# Patient Record
Sex: Female | Born: 1965 | Race: White | Hispanic: No | Marital: Married | Smoking: Former smoker
Health system: Southern US, Community
[De-identification: ages and names within clinical notes are randomized; demographics above are authoritative.]

## PROBLEM LIST (undated history)

## (undated) DIAGNOSIS — R51 Headache: Secondary | ICD-10-CM

## (undated) DIAGNOSIS — R519 Headache, unspecified: Secondary | ICD-10-CM

## (undated) DIAGNOSIS — N200 Calculus of kidney: Secondary | ICD-10-CM

## (undated) DIAGNOSIS — R112 Nausea with vomiting, unspecified: Secondary | ICD-10-CM

## (undated) DIAGNOSIS — M1990 Unspecified osteoarthritis, unspecified site: Secondary | ICD-10-CM

## (undated) DIAGNOSIS — R609 Edema, unspecified: Secondary | ICD-10-CM

## (undated) DIAGNOSIS — Z9889 Other specified postprocedural states: Secondary | ICD-10-CM

## (undated) DIAGNOSIS — G459 Transient cerebral ischemic attack, unspecified: Secondary | ICD-10-CM

## (undated) DIAGNOSIS — R600 Localized edema: Secondary | ICD-10-CM

## (undated) DIAGNOSIS — K219 Gastro-esophageal reflux disease without esophagitis: Secondary | ICD-10-CM

## (undated) DIAGNOSIS — M797 Fibromyalgia: Secondary | ICD-10-CM

## (undated) DIAGNOSIS — R200 Anesthesia of skin: Secondary | ICD-10-CM

## (undated) DIAGNOSIS — R569 Unspecified convulsions: Secondary | ICD-10-CM

## (undated) HISTORY — PX: KNEE ARTHROSCOPY: SUR90

## (undated) HISTORY — PX: HAND SURGERY: SHX662

## (undated) HISTORY — DX: Calculus of kidney: N20.0

## (undated) HISTORY — DX: Localized edema: R60.0

## (undated) HISTORY — PX: OOPHORECTOMY: SHX86

## (undated) HISTORY — DX: Anesthesia of skin: R20.0

## (undated) HISTORY — DX: Edema, unspecified: R60.9

---
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
## 1988-11-30 HISTORY — PX: APPENDECTOMY: SHX54

## 2009-11-30 HISTORY — PX: CARPAL TUNNEL RELEASE: SHX101

## 2013-10-13 ENCOUNTER — Ambulatory Visit: Payer: Self-pay | Admitting: Family Medicine

## 2014-02-20 ENCOUNTER — Ambulatory Visit: Payer: Self-pay | Admitting: Family Medicine

## 2014-03-07 ENCOUNTER — Emergency Department: Payer: Self-pay | Admitting: Emergency Medicine

## 2014-03-07 LAB — COMPREHENSIVE METABOLIC PANEL
AST: 51 U/L — AB (ref 15–37)
Albumin: 3.8 g/dL (ref 3.4–5.0)
Alkaline Phosphatase: 70 U/L
Anion Gap: 4 — ABNORMAL LOW (ref 7–16)
BUN: 20 mg/dL — ABNORMAL HIGH (ref 7–18)
Bilirubin,Total: 0.4 mg/dL (ref 0.2–1.0)
CHLORIDE: 106 mmol/L (ref 98–107)
CO2: 25 mmol/L (ref 21–32)
Calcium, Total: 8.5 mg/dL (ref 8.5–10.1)
Creatinine: 0.49 mg/dL — ABNORMAL LOW (ref 0.60–1.30)
Glucose: 111 mg/dL — ABNORMAL HIGH (ref 65–99)
OSMOLALITY: 273 (ref 275–301)
POTASSIUM: 4.1 mmol/L (ref 3.5–5.1)
SGPT (ALT): 46 U/L (ref 12–78)
SODIUM: 135 mmol/L — AB (ref 136–145)
Total Protein: 7.9 g/dL (ref 6.4–8.2)

## 2014-03-07 LAB — URINALYSIS, COMPLETE
BACTERIA: NONE SEEN
BLOOD: NEGATIVE
Bilirubin,UR: NEGATIVE
GLUCOSE, UR: NEGATIVE mg/dL (ref 0–75)
Ketone: NEGATIVE
LEUKOCYTE ESTERASE: NEGATIVE
NITRITE: NEGATIVE
Ph: 8 (ref 4.5–8.0)
Protein: NEGATIVE
RBC,UR: <1 /HPF (ref 0–5)
Specific Gravity: 1.015 (ref 1.003–1.030)
Squamous Epithelial: 2

## 2014-03-07 LAB — TROPONIN I: Troponin-I: <0.02 ng/mL

## 2014-03-07 LAB — CK TOTAL AND CKMB (NOT AT ARMC)
CK, Total: 146 U/L
CK-MB: 1.4 ng/mL (ref 0.5–3.6)

## 2014-03-07 LAB — CBC
HCT: 40.4 % (ref 35.0–47.0)
HGB: 13.7 g/dL (ref 12.0–16.0)
MCH: 35.7 pg — ABNORMAL HIGH (ref 26.0–34.0)
MCHC: 33.8 g/dL (ref 32.0–36.0)
MCV: 106 fL — ABNORMAL HIGH (ref 80–100)
Platelet: 248 10*3/uL (ref 150–440)
RBC: 3.83 10*6/uL (ref 3.80–5.20)
RDW: 13.5 % (ref 11.5–14.5)
WBC: 6 10*3/uL (ref 3.6–11.0)

## 2014-03-19 ENCOUNTER — Ambulatory Visit: Payer: Self-pay | Admitting: Neurology

## 2014-03-19 ENCOUNTER — Inpatient Hospital Stay: Payer: Self-pay | Admitting: Internal Medicine

## 2014-03-19 DIAGNOSIS — I369 Nonrheumatic tricuspid valve disorder, unspecified: Secondary | ICD-10-CM

## 2014-03-19 LAB — URINALYSIS, COMPLETE
Bacteria: NONE SEEN
Bilirubin,UR: NEGATIVE
Blood: NEGATIVE
Glucose,UR: NEGATIVE mg/dL (ref 0–75)
Ketone: NEGATIVE
Leukocyte Esterase: NEGATIVE
Nitrite: NEGATIVE
PROTEIN: NEGATIVE
Ph: 9 (ref 4.5–8.0)
RBC, UR: NONE SEEN /HPF (ref 0–5)
Specific Gravity: 1.009 (ref 1.003–1.030)
WBC UR: 1 /HPF (ref 0–5)

## 2014-03-19 LAB — COMPREHENSIVE METABOLIC PANEL
ALK PHOS: 61 U/L
ALT: 29 U/L (ref 12–78)
AST: 26 U/L (ref 15–37)
Albumin: 3.7 g/dL (ref 3.4–5.0)
Anion Gap: 7 (ref 7–16)
BUN: 13 mg/dL (ref 7–18)
Bilirubin,Total: 0.3 mg/dL (ref 0.2–1.0)
CALCIUM: 8.1 mg/dL — AB (ref 8.5–10.1)
CHLORIDE: 107 mmol/L (ref 98–107)
CREATININE: 0.71 mg/dL (ref 0.60–1.30)
Co2: 28 mmol/L (ref 21–32)
EGFR (African American): >60
Glucose: 87 mg/dL (ref 65–99)
Osmolality: 283 (ref 275–301)
Potassium: 3.5 mmol/L (ref 3.5–5.1)
Sodium: 142 mmol/L (ref 136–145)
TOTAL PROTEIN: 7 g/dL (ref 6.4–8.2)

## 2014-03-19 LAB — CBC WITH DIFFERENTIAL/PLATELET
BASOS ABS: 0.1 10*3/uL (ref 0.0–0.1)
Basophil %: 1.2 %
EOS ABS: 0.1 10*3/uL (ref 0.0–0.7)
Eosinophil %: 1 %
HCT: 38.4 % (ref 35.0–47.0)
HGB: 12.7 g/dL (ref 12.0–16.0)
LYMPHS PCT: 44.3 %
Lymphocyte #: 2.4 10*3/uL (ref 1.0–3.6)
MCH: 34.2 pg — ABNORMAL HIGH (ref 26.0–34.0)
MCHC: 33 g/dL (ref 32.0–36.0)
MCV: 104 fL — ABNORMAL HIGH (ref 80–100)
Monocyte #: 0.5 x10 3/mm (ref 0.2–0.9)
Monocyte %: 9 %
NEUTROS ABS: 2.5 10*3/uL (ref 1.4–6.5)
Neutrophil %: 44.5 %
PLATELETS: 232 10*3/uL (ref 150–440)
RBC: 3.71 10*6/uL — ABNORMAL LOW (ref 3.80–5.20)
RDW: 13.3 % (ref 11.5–14.5)
WBC: 5.5 10*3/uL (ref 3.6–11.0)

## 2014-03-19 LAB — ETHANOL
Ethanol %: <0.003 % (ref 0.000–0.080)
Ethanol: <3 mg/dL

## 2014-03-19 LAB — PROTIME-INR
INR: 0.9
PROTHROMBIN TIME: 11.6 s (ref 11.5–14.7)

## 2014-03-19 LAB — DRUG SCREEN, URINE
Amphetamines, Ur Screen: NEGATIVE (ref ?–1000)
Barbiturates, Ur Screen: NEGATIVE (ref ?–200)
Benzodiazepine, Ur Scrn: NEGATIVE (ref ?–200)
Cannabinoid 50 Ng, Ur ~~LOC~~: NEGATIVE (ref ?–50)
Cocaine Metabolite,Ur ~~LOC~~: NEGATIVE (ref ?–300)
MDMA (Ecstasy)Ur Screen: NEGATIVE (ref ?–500)
Methadone, Ur Screen: NEGATIVE (ref ?–300)
OPIATE, UR SCREEN: POSITIVE (ref ?–300)
PHENCYCLIDINE (PCP) UR S: NEGATIVE (ref ?–25)
TRICYCLIC, UR SCREEN: NEGATIVE (ref ?–1000)

## 2014-03-20 LAB — CBC WITH DIFFERENTIAL/PLATELET
BASOS ABS: 0 10*3/uL (ref 0.0–0.1)
Basophil %: 0.9 %
EOS PCT: 1.1 %
Eosinophil #: 0 10*3/uL (ref 0.0–0.7)
HCT: 37.6 % (ref 35.0–47.0)
HGB: 12.8 g/dL (ref 12.0–16.0)
Lymphocyte #: 1.5 10*3/uL (ref 1.0–3.6)
Lymphocyte %: 36.7 %
MCH: 35.8 pg — ABNORMAL HIGH (ref 26.0–34.0)
MCHC: 34 g/dL (ref 32.0–36.0)
MCV: 105 fL — ABNORMAL HIGH (ref 80–100)
Monocyte #: 0.4 x10 3/mm (ref 0.2–0.9)
Monocyte %: 10 %
Neutrophil #: 2.1 10*3/uL (ref 1.4–6.5)
Neutrophil %: 51.3 %
PLATELETS: 217 10*3/uL (ref 150–440)
RBC: 3.57 10*6/uL — AB (ref 3.80–5.20)
RDW: 12.9 % (ref 11.5–14.5)
WBC: 4 10*3/uL (ref 3.6–11.0)

## 2014-03-20 LAB — LIPID PANEL
Cholesterol: 203 mg/dL — ABNORMAL HIGH (ref 0–200)
HDL: 107 mg/dL — AB (ref 40–60)
Ldl Cholesterol, Calc: 79 mg/dL (ref 0–100)
TRIGLYCERIDES: 87 mg/dL (ref 0–200)
VLDL Cholesterol, Calc: 17 mg/dL (ref 5–40)

## 2014-03-20 LAB — BASIC METABOLIC PANEL
ANION GAP: 4 — AB (ref 7–16)
BUN: 11 mg/dL (ref 7–18)
CO2: 26 mmol/L (ref 21–32)
CREATININE: 0.82 mg/dL (ref 0.60–1.30)
Calcium, Total: 8.9 mg/dL (ref 8.5–10.1)
Chloride: 110 mmol/L — ABNORMAL HIGH (ref 98–107)
EGFR (African American): >60
EGFR (Non-African Amer.): >60
Glucose: 106 mg/dL — ABNORMAL HIGH (ref 65–99)
Osmolality: 279 (ref 275–301)
POTASSIUM: 3.3 mmol/L — AB (ref 3.5–5.1)
Sodium: 140 mmol/L (ref 136–145)

## 2014-03-20 LAB — TSH: THYROID STIMULATING HORM: 2.59 u[IU]/mL

## 2014-05-04 ENCOUNTER — Ambulatory Visit: Payer: Self-pay | Admitting: Physician Assistant

## 2014-05-09 ENCOUNTER — Ambulatory Visit: Payer: Self-pay | Admitting: Obstetrics and Gynecology

## 2014-05-09 LAB — BASIC METABOLIC PANEL
Anion Gap: 5 — ABNORMAL LOW (ref 7–16)
BUN: 11 mg/dL (ref 7–18)
CHLORIDE: 106 mmol/L (ref 98–107)
CO2: 26 mmol/L (ref 21–32)
Calcium, Total: 9 mg/dL (ref 8.5–10.1)
Creatinine: 0.7 mg/dL (ref 0.60–1.30)
EGFR (African American): >60
GLUCOSE: 73 mg/dL (ref 65–99)
Osmolality: 272 (ref 275–301)
POTASSIUM: 3.1 mmol/L — AB (ref 3.5–5.1)
Sodium: 137 mmol/L (ref 136–145)

## 2014-05-09 LAB — CBC
HCT: 40.2 % (ref 35.0–47.0)
HGB: 13.3 g/dL (ref 12.0–16.0)
MCH: 33.8 pg (ref 26.0–34.0)
MCHC: 33 g/dL (ref 32.0–36.0)
MCV: 102 fL — ABNORMAL HIGH (ref 80–100)
Platelet: 278 10*3/uL (ref 150–440)
RBC: 3.93 10*6/uL (ref 3.80–5.20)
RDW: 14.1 % (ref 11.5–14.5)
WBC: 7.8 10*3/uL (ref 3.6–11.0)

## 2014-05-14 ENCOUNTER — Ambulatory Visit: Payer: Self-pay | Admitting: Obstetrics and Gynecology

## 2014-05-16 LAB — PATHOLOGY REPORT

## 2014-07-22 ENCOUNTER — Emergency Department: Payer: Self-pay | Admitting: Emergency Medicine

## 2014-09-03 ENCOUNTER — Ambulatory Visit: Payer: Self-pay | Admitting: Orthopedic Surgery

## 2014-09-17 ENCOUNTER — Ambulatory Visit: Payer: Self-pay | Admitting: Orthopedic Surgery

## 2014-09-17 LAB — PROTIME-INR
INR: 0.8
Prothrombin Time: 11.2 secs — ABNORMAL LOW (ref 11.5–14.7)

## 2014-09-17 LAB — URINALYSIS, COMPLETE
BILIRUBIN, UR: NEGATIVE
Bacteria: NONE SEEN
Blood: NEGATIVE
Glucose,UR: NEGATIVE mg/dL (ref 0–75)
Ketone: NEGATIVE
Leukocyte Esterase: NEGATIVE
Nitrite: NEGATIVE
PH: 8 (ref 4.5–8.0)
PROTEIN: NEGATIVE
Specific Gravity: 1.012 (ref 1.003–1.030)
Squamous Epithelial: 2
WBC UR: NONE SEEN /HPF (ref 0–5)

## 2014-09-17 LAB — BASIC METABOLIC PANEL
Anion Gap: 8 (ref 7–16)
BUN: 13 mg/dL (ref 7–18)
CHLORIDE: 106 mmol/L (ref 98–107)
CO2: 28 mmol/L (ref 21–32)
Calcium, Total: 8.2 mg/dL — ABNORMAL LOW (ref 8.5–10.1)
Creatinine: 0.82 mg/dL (ref 0.60–1.30)
EGFR (African American): >60
EGFR (Non-African Amer.): >60
GLUCOSE: 124 mg/dL — AB (ref 65–99)
OSMOLALITY: 285 (ref 275–301)
POTASSIUM: 3.6 mmol/L (ref 3.5–5.1)
Sodium: 142 mmol/L (ref 136–145)

## 2014-09-17 LAB — CBC
HCT: 41.3 % (ref 35.0–47.0)
HGB: 13.6 g/dL (ref 12.0–16.0)
MCH: 35 pg — AB (ref 26.0–34.0)
MCHC: 32.8 g/dL (ref 32.0–36.0)
MCV: 107 fL — ABNORMAL HIGH (ref 80–100)
PLATELETS: 221 10*3/uL (ref 150–440)
RBC: 3.88 10*6/uL (ref 3.80–5.20)
RDW: 14.4 % (ref 11.5–14.5)
WBC: 5.5 10*3/uL (ref 3.6–11.0)

## 2014-09-17 LAB — APTT: ACTIVATED PTT: 26.5 s (ref 23.6–35.9)

## 2014-09-20 ENCOUNTER — Ambulatory Visit: Payer: Self-pay | Admitting: Orthopedic Surgery

## 2014-11-30 DIAGNOSIS — G459 Transient cerebral ischemic attack, unspecified: Secondary | ICD-10-CM

## 2014-11-30 HISTORY — DX: Transient cerebral ischemic attack, unspecified: G45.9

## 2014-11-30 HISTORY — PX: KNEE ARTHROSCOPY: SUR90

## 2014-11-30 HISTORY — PX: ABDOMINAL HYSTERECTOMY: SHX81

## 2015-03-23 NOTE — H&P (Signed)
PATIENT NAME:  Charlotte Hernandez, Charlotte Hernandez MR#:  [id number] DATE OF BIRTH:  Jan 02, 1966  DATE OF ADMISSION:  03/19/2014  PRIMARY CARE PROVIDER: Dr. Helane Rima.   CHIEF COMPLAINT: Left-sided facial droop, left-sided numbness.   HISTORY OF PRESENT ILLNESS: The patient is a 49 year old white female with history of seizure disorder, reflux who states that she woke up this morning with left-sided facial droop, and also was having full face numbness, as well as left-sided numbness. The patient reports that she had similar symptoms with the left-sided numbness last week, was seen in the ED; also had chest pain, was discharged with diagnosis of costochondritis, who presents with these symptoms. She states that her whole left side of her body feels like it is asleep. She otherwise denies any double vision. Her facial droop is better. Her speech is better as well. She had difficulty initially with saying what she wanted to say. The patient, when she arrived in the ED, tele neurology was consulted. They did not recommend tPA. She, otherwise, denies any fevers, chills. She complains of pain in the left lower abdomen for which she is being worked up as pelvic pain and has been referred to a pelvic pain clinic at [facility]; is still awaiting an appointment.   PAST MEDICAL HISTORY: Significant for seizure disorder, history of enlarged lymph nodes in her groin, has GERD, is on hydrochlorothiazide and likely has hypertension, but the patient denies having hypertension.   ALLERGIES: None.   CURRENT MEDICATIONS: At home, she is on hydrochlorothiazide 12.5 p.o. daily, Ativan 1 mg every 8 hours as needed, ibuprofen 800 mg t.i.d. p.r.n. for pain, Prilosec 40 daily, Topamax 100, 1 tab p.o. b.i.d.; Tylenol PM 500 mg with 25 of Benadryl at bedtime p.r.n., vitamin B12, 250 daily; Prilosec 40 daily.   ALLERGIES: None.   SOCIAL HISTORY: Smokes cigarettes along with using vapor. States that she is trying to quit. Denies any alcohol use or  drug use.   FAMILY HISTORY: Positive for hypertension, diabetes.   REVIEW OF SYSTEMS:  CONSTITUTIONAL:  Generally, denies any weight gain, weight loss. Denies any fevers or chills. Complains of fatigue.  HEENT: Denies any blurred vision, double vision. No inflammation. No cataracts. No glaucoma. Denies any epistaxis. No nasal drainage. No seasonal allergies. No difficulty swallowing. No ear pain. No tinnitus.  CARDIOVASCULAR: Denies any chest pain, palpitations. No syncope. No arrhythmias.  PULMONARY: Denies any cough, wheezing, hemoptysis. No COPD.  GASTROINTESTINAL: Denies any nausea, vomiting, diarrhea. No abdominal pain. No hematemesis. No hematochezia.  GENITOURINARY: Denies any frequency, urgency or hesitancy.  Has chronic left lower quadrant abdominal pain. MUSCULOSKELETAL: Denies any pain in the neck, back or shoulder.   SKIN: Denies any rash.  LYMPHATICS: Denies any lymph node enlargement.  VASCULAR: Denies any claudications.  NEUROLOGIC: Has a history of seizure disorder; last seizure 2 years ago. No previous history of CVA, TIA.  PSYCHIATRIC: Has some anxiety.   PHYSICAL EXAMINATION: VITAL SIGNS: Temperature 98.2, pulse 76, respirations 18, blood pressure 124/75, O2 100%.  GENERAL: The patient is a well-developed, well-nourished female in no acute distress.  HEENT: Head: Atraumatic, normocephalic. Pupils equally round, reactive to light and accommodation. There is no conjunctival pallor. No scleral icterus. Nasal exam shows no drainage or ulceration.  Oropharynx is clear without any exudate.  NECK: Supple without any JVD.  CARDIOVASCULAR: Regular rate and rhythm. No murmurs, rubs,  clicks or gallops. LUNGS: Clear to auscultation bilaterally without any rales, rhonchi, wheezing.  ABDOMEN: Soft, nontender, nondistended. Positive bowel sounds  x 4.  EXTREMITIES: No clubbing, cyanosis or edema.  SKIN: No rash.  LYMPHATICS: No lymph nodes palpable.  VASCULAR: Good DP, PT pulses.   PSYCHIATRIC: Appears with a flat affect. Currently, not anxious. Awake, alert, oriented x 3.   NEUROLOGICAL: Cranial nerves II through XII grossly intact. I do not appreciate much of a facial droop. Strength is intact in all 4 extremities. Reflexes 2+. There is no diminished sensation noted to touch.   LABORATORY, RADIOLOGIC AND DIAGNOSTICS:  EKG: Normal sinus rhythm without any ST-T wave changes. Labs:  Glucose 87, BUN 13, creatinine 0.71, sodium 142, potassium 3.5, chloride 107, CO2 is 28. Alcohol level less than 0.003. LFTs are normal. Toxic urine drug screen positive for opioids. WBC 5.5, hemoglobin 12.7, platelet count 232. CT scan of the head without contrast shows no acute abnormality.   ASSESSMENT AND PLAN: The patient is a 49 year old white female, who presents with fascial droop and left-sided weakness.  1.  Facial droop, left-sided weakness, possible transient ischemic attack possible cerebrovascular accident. Her presentation is kind of atypical with the whole facial numbness. At this time, we will await MRI results, get carotid Dopplers, echocardiogram, place her on aspirin. I will ask neurology to see due to the distribution of findings or symptoms. We will also check an echocardiogram of the heart.  2.  Hypertension. We will continue hydrochlorothiazide.  3.  Gastroesophageal reflux disease. We will continue PPIs.  4.  Seizure disorder. Continue Topamax as taking at home.  5.  Miscellaneous: The patient will be on Lovenox for DVT prophylaxis.   TIME SPENT: 45 minutes on this H and P.      ____________________________ Serita GritShreyang H. Allena KatzPatel, MD shp:dmm D: 03/19/2014 11:19:07 ET T: 03/19/2014 11:33:17 ET JOB#: [id number]  cc: Mayrani Khamis H. Allena KatzPatel, MD, <Dictator> Charise CarwinSHREYANG H Miri Jose MD ELECTRONICALLY SIGNED 03/21/2014 16:25

## 2015-03-23 NOTE — Op Note (Signed)
PATIENT NAME:  Charlotte Hernandez, HARTSFIELD MR#:  [id number] DATE OF BIRTH:  02-11-1966  DATE OF PROCEDURE:  09/20/2014  PREOPERATIVE DIAGNOSIS: Left knee anterior cruciate ligament tear, lateral meniscus tear and partial medial collateral ligament and posterior collateral ligament tears.   POSTOPERATIVE DIAGNOSIS: Complete tear of left anterior cruciate ligament, longitudinal tear of the posterior horn of the lateral meniscus, partial medial collateral ligament tear, and sprain of the posterior cruciate ligament.   PROCEDURE PERFORMED: Left knee anterior cruciate ligament reconstruction with hybrid hamstring autograft and allograft with lateral meniscus repair.   ANESTHESIA: General with left femoral nerve block.   SURGEON: Timoteo Gaul, MD   ESTIMATED BLOOD LOSS: Minimal.   COMPLICATIONS: None.   IMPLANTS: Arthrex TightRope RC for femoral fixation, a 9 x 28 mm Arthrex BioComposite screw with a Richards spiked staple for back-up tibial fixation.   INDICATION FOR PROCEDURE: Charlotte Hernandez is a 49 year old female who slipped in her garage taking out the trash. She had persistent pain and instability in the left knee. An MRI documented a tear of her ACL, tear of the posterior horn of the lateral meniscus, a high-grade partial tear of the medial collateral ligament, and sprain of the posterior collateral ligament. Given the patient's persistent instability, she wished to proceed with surgery.    In the office, prior to the date of surgery, I reviewed the risks and benefits of surgery with the patient. These risks include: Infection, bleeding, nerve or blood vessel injury, knee stiffness, persistent pain or instability, hardware failure, retear of the ACL or retear of the lateral meniscus, arthrofibrosis, and the need for further surgery. Medical risks include, but are not limited to, DVT and pulmonary embolism, myocardial infarction, stroke, pneumonia, respiratory failure and death. The patient understood  these risks and wished to proceed.   PROCEDURE NOTE: The patient was met in the preoperative area. Her husband was at the bedside. I marked the left knee with the word "yes" and my initials, according to the hospital's right site protocol. I answered all the patient's questions. I updated her history and physical.   The patient was then brought to the Operating Room where she was placed supine on the operative table. She underwent a femoral nerve block by the anesthesia service. She then underwent general anesthesia. A timeout was performed to verify the patient's name, date of birth, medical record number, correct side of the surgery and the correct procedure to be performed. It was also used to verify the patient had received antibiotics, and that the appropriate instruments, implants, and radiographic studies were available in the room. Once all in attendance were in agreement, the case began.   Examination under anesthesia revealed the patient had 0-120 degrees range of motion. She had no large effusion. Her skin was intact. The patient had a positive Lachman test with anterior instability of approximately 5-10 mm. There was a positive pivot shift and anterior laxity on anterior drawer testing. She did not have a posterior drawer sign. The patient also did not have significant laxity with valgus stress testing of the left knee at 0 and 30 degrees of flexion. She had no lateral instability with varus stress testing of the left knee.   The patient's bony landmarks were drawn out with a surgical marker. The proposed arthroscopy incisions were drawn out with the surgical marker as well, and pre-injected with 1% lidocaine plain. An 11 blade was used to establish lateral and medial arthroscopy portals. These were made with an 11 blade.  The medial portal was created under direct visualization using an 18 gauge spinal needle for localization. Immediately following insertion of the arthroscope, significant  synovitis was encountered. A 90 degree ArthroCare wand was used to debride all this synovitis to allow for visualization. The torn ACL fibers were easily visualized. These were debrided using a 90 degree arthrocare wand and 4-0 resector shaver blade.   A full diagnostic examination of the knee was then performed including the suprapatellar pouch, the patellofemoral joint, the medial and lateral gutters, and the mediolateral compartments, as well as the intercondylar notch and posterior knee. Findings on arthroscopy included a complete tear of the anterior cruciate ligament. There was no chondral injury or meniscal tear in the medial compartment. The patient had a large longitudinal tear near the periphery of the posterior horn of the lateral meniscus. This lateral meniscus tear was very unstable to probing with a hook probe. Given its instability and peripheral location, the decision was made to try to repair the tear.   A Smith [employer] Nephew Fast-Fix 360 curved anchor was then implanted in the lateral meniscus under direct arthroscopic visualization. This was tensioned to allow for excellent approximation of the fracture. Again, the meniscus was probed with a hook and found to be stable.   The patient had significant softening of the cartilage on the undersurface of the patella, along with a large fissure in the central ridge. There were some loose chondral flaps stemming from this fissure. A chondroplasty was performed to the undersurface of the patella to remove any overtly loose cartilage flaps.   The arthroscopic instruments were then removed from the knee. The attention was turned to graft harvest.   A longitudinal incision over the anterolateral proximal tibia was made approximately 3 cm in length. The hamstring tendons were identified by palpation. The sartorius fascia was then incised in an L-shaped incision. This allowed for reflection of the sartorius fascia. There was some scarring on the  undersurface of the sartorius, possibly due to the MCL injury. This was debrided. The semitendinosus tendon was removed using a tendon stripper and found to be of adequate length and diameter. The gracilis tendon was too diminutive to use; therefore, 2 gracilis allografts were utilized. The total composite diameter of the patient's semitendinosus and the 2 allograft gracilis measured 9 mm in diameter on the femoral and tibial sides. The grafts were prepared on the back table using Fiber loop sutures. They were placed under 15 mm of tension on the Graftmaster and kept moist with a Ray-Tec sponge while the tunnels were created.   The femoral tunnel drill guide was then inserted through the inferolateral arthroscopic portal and placed over the ACL footprint. Fibers of the original ACL had been left in place to identify the original femoral origin. A small stab incision was made over the anterolateral distal femur to allow for insertion of the drill guide along the lateral femur. A size 9 Retro Drill bit was then advanced through the femoral condyle and into the intercondylar notch. The flip cutter drill guide was then engaged and a femoral tunnel was created in a retrograde fashion for approximately 30 mm in length. A Fiber Stick suture was then placed through the femoral tunnel and brought out through the inferolateral portal. Both ends of this suture were clamped with a hemostat for later shuttling of the hamstring graft through the knee.   A size 9 Retro Drill guide was then inserted through the inferomedial portal. This was placed over the  original insertion footprint of the ACL on the tibial plateau. A drill pin was then inserted through this drill guide from the anterior cortex of the tibia into the joint. It engaged the size 9 Retro Drill bit and a size 9 diameter tunnel was created in a retrograde fashion. The Fiber Stick suture was then brought out through the tibial tunnel. This allowed for shuttling of  the hamstring graft through the knee.   The Arthrex TightRope RT button was then placed on the 4-stranded graft on the back table. The Fiber Stick suture was used to tunnel the sutures of the hamstring graft through the knee. These sutures were then used to shuttle the graft through the center of the knee. The TightRope RC button was then flipped. FluoroScan images of the femur were taken to ensure that the button had flipped outside the lateral cortex and was engaged along the lateral aspect of the cortex. The graft was then advanced using the sutures of the TightRope RT button. Once this was bottomed out into the femoral tunnel, the graft was cycled 25 times to remove creep from the graft. The knee was then placed at 30 degrees of flexion. A posterior drawer force was applied to the anterior tibia, while countertraction was applied to the tibial sutures of the hamstring graft. While this tension was applied, a 9 x 28 mm Arthrex BioComposite screw was inserted into the tibial tunnel. It had an excellent snug fit. A spiked ligament staple was then malleted into position as back-up fixation along the anterior tibia. The remaining sutures from the graft were cut.   The arthroscope was placed back in the knee for final images of the ACL graft. The graft was also probed under direct visualization and had excellent tension. There was no graft impingement in full extension. Final images of the medial and lateral compartments revealed no osseous debris. The lateral meniscus was well reduced.   The joint was copiously irrigated and all arthroscopic instruments were removed. The anterior tibial incision was closed with 0 Vicryl, 2-0 Vicryl and running 4-0 undyed Monocryl. The 2 arthroscopy incisions and the anterolateral femoral stab incision were closed with 4-0 nylon. A dry sterile compressive dressing was applied to the left knee, along with a Polar Care sleeve and a Breg telescoping knee brace sized appropriately  for the patient and locked in extension was also applied. TENS unit leads were applied to the left leg as well.   The patient was then awakened and brought to the PACU in stable condition.   I was scrubbed and present for the entire case, and all sharp and instrument counts were correct at the conclusion of the case. I spoke with the patient's husband in the postoperative consultation room to let him know that the patient was stable in the recovery room and the case had gone without complication.    ____________________________ Timoteo Gaul, MD klk:MT D: 09/26/2014 15:40:43 ET T: 09/26/2014 16:01:30 ET JOB#: [id number]  cc: Timoteo Gaul, MD, <Dictator> Timoteo Gaul MD ELECTRONICALLY SIGNED 09/28/2014 23:55

## 2015-03-23 NOTE — Consult Note (Signed)
PATIENT NAME:  Charlotte Hernandez, Charlotte Hernandez MR#:  [id number] DATE OF BIRTH:  07-02-1966  DATE OF CONSULTATION:  03/19/2014  CONSULTING PHYSICIAN:  Pauletta Browns, MD  REASON FOR CONSULTATION: Rule out stroke.   HISTORY OF PRESENT ILLNESS: This is a pleasant 49 year old female with past medical history of seizure disorder, the last seizure about two years ago and described as generalized tonic-clonic seizure on Topamax 200 mg daily, reflux, who stated that woke up 6:00 in the morning, was noted to have left-sided droop. After which she has noted that she had numbness in the whole left side.  Similar symptoms about a week ago, was seen in the Emergency Department with chest pain. Discharged with a  diagnosis  with costochondritis with resolution of symptoms. Upon further evaluation today the patient's symptoms have improved. She does appear to have dysarthric speech, but dysarthric speech is distractible.   PAST MEDICAL HISTORY: Significant for seizure disorder, the last seizure about two years ago, described generalized tonic-clonic. History of GERD.  History of hypertension.   ALLERGIES: None.   HOME MEDICATIONS: Include hydrochlorothiazide, Ativan, ibuprofen and Prilosec, Topamax.   SOCIAL HISTORY: She is a cigarette smoker. No drug or alcohol use.   REVIEW OF SYSTEMS:  CONSTITUTIONAL:  Generally denies any fatigue or any generalized pain, denies any fever, denies any blurred vision, double vision.  CARDIOVASCULAR: Denies any chest pain, palpitation, syncopal episodes.  PULMONARY: Denies any cough, wheezing, hemoptysis.  GASTROINTESTINAL: Denies any nausea, vomiting, diarrhea.  GENITOURINARY: Denies any frequency, urgency or hesitancy.  MUSCULOSKELETAL: Denies any pain.   LABORATORY DATA: Work-up has been reviewed.   IMAGING: The patient is status post MRI of the brain that did not show any acute intracranial abnormality. Carotid ultrasound. No significant hemodynamic stenosis.   PHYSICAL  EXAMINATION: The patient is alert, awake, oriented to time, place, location and the reason why she is in the hospital. Extraocular movements are intact. Pupils are 3 mm to 2 mm, reactive bilaterally. Visual fields appear to be intact. The patient's sensation, states there is decreased sensation on the left side. Facial motor is intact. Tongue is midline. When further examining her the patient does split the midline on the left side of the face, states there is some numbness in the left upper extremity and left lower extremity that comes and goes. Speech appears to be dysarthric, but distractible and comes back to normal. No significant motor deficits.  SENSATION: Decreased sensation on left side that is subjective and that comes and goes. Coordination: Finger-to-nose intact. Gait: Is not assessed.   IMPRESSION: This is a 49 year old female with history of seizure disorder, the last one about two years ago. Describes generalized tonic-clonic on Topamax 200 mg a day, no recent history of seizures, presenting with numbness in the left side, left face and left upper/left lower extremity that has improved. The patient has dysarthric speech, which is completely distractible and improves when having a fluent conversation with her.   On physical examination the patient does cross midline on sensory examination, meaning if you cross from the left side suddenly to the right side her sensation slightly improves, which is not physiologically as fibers do cross midline.   IMAGING: Negative as above.   PLAN: The patient's symptoms are not consistent. Work-up done as above. I do not think there is any further work-up needed in the hospital. I believe some of her symptoms are anxiety  provoked. The patient should follow up with psychiatry as an outpatient. No further need for neurological  evaluation while in the hospital.   Thank you, it was a pleasure seeing this patient.    ____________________________ Pauletta BrownsYuriy  Jameika Kinn, MD yz:sg D: 03/19/2014 17:22:46 ET T: 03/19/2014 19:32:18 ET JOB#: [id number]  cc: Pauletta BrownsYuriy Raylynn Hersh, MD, <Dictator> Pauletta BrownsYURIY Avaeh Ewer MD ELECTRONICALLY SIGNED 03/28/2014 11:32

## 2015-03-23 NOTE — Discharge Summary (Signed)
PATIENT NAME:  Charlotte Hernandez, Charlotte Hernandez MR#:  [id number] DATE OF BIRTH:  01-07-1966  DATE OF ADMISSION:  03/19/2014 DATE OF DISCHARGE:  03/20/2014  ADMITTING DIAGNOSES: Facial numbness and left-sided facial droop, as well as numbness in the left whole side of the body.   DISCHARGE DIAGNOSES: 1. Left-sided weakness, facial droop, felt to be possible transient ischemic attack,  possibly related to stressors in life with negative MRI of the brain, as well as carotid Doppler showing no significant carotid artery stenosis.  2. History of seizure disorder with electroencephalogram  being negative.  3. History of enlarged lymph nodes in her groin as well as pelvic pain, [facility].  4. Gastroesophageal reflux disease.  5. Hypertension.  6. Hypokalemia.   CONSULTANTS: Dr. Loretha BrasilZeylikman of neurology.   PERTINENT LABS AND EVALUATIONS: Admitting glucose 87, BUN 13, creatinine 0.71, sodium 142, potassium 3.5, chloride 107, CO2 was 28. Calcium was 8.1. Alcohol level was less than 0.003. LFTs were normal. Toxic urine and drug screen was opiate positive. WBC 5.5, hemoglobin 12.7, platelet count 232, INR 0.9. Urinalysis was negative. Vitamin B12 level was 1678. Echocardiogram  of the heart showed normal ejection fraction, impaired relaxation pattern on the left ventricular diastolic filling, EEG showed no seizure activity.   HOSPITAL COURSE: The patient is a 49 year old white female with history of seizure disorder, reflux who presented on the morning of admission with left-sided facial droop, facial numbness and the whole body of her left side being numb. The patient was seen in the ED. A tele neurology was consulted. They recommended no tPA. CT scan of the head was negative. Her symptoms were inconsistent in terms of the lesion in the brain. She was admitted for possible cerebrovascular accident/transient ischemic attack, which work-up included MR of the brain, which was completely negative.  She had an echocardiogram of the heart which showed no evidence of any thrombus or any other significant abnormality, except mild tricuspid regurg. The patient was seen in consultation by Dr. Loretha BrasilZeylikman of neurology, who again felt that her symptoms were inconsistent and felt that these symptoms could be related to significant stressors in life and recommended discharge to home. At this time, the patient is stable for discharge to home with outpatient neurology follow-up, may need EMG.   DISCHARGE MEDICATIONS: Topamax 100 mg 1 tab p.o. b.i.d., hydrochlorothiazide 12.5 mg 1 tab p.o. daily, Ativan 1 mg q.8 p.r.n. for anxiety, ibuprofen 800 t.i.d. as needed, vitamin B12 250 mcg daily, Tylenol 500/25 at bedtime p.r.n. sleep. Prilosec 40, 1 tab p.o. b.i.d., acetaminophen 650 q.4 p.r.n. for pain, aspirin 81 mg 1 tab p.o. daily.   DIET: Low-sodium, low-fat, low-cholesterol.   ACTIVITY: As tolerated.   Follow up with primary M.D. in 1 to 2 weeks. [facility] 2 to 4 weeks.   TIME SPENT: 35 minutes.   ____________________________ Charlotte Hernandez. Allena KatzPatel, MD shp:sg D: 03/21/2014 10:38:27 ET T: 03/21/2014 11:32:03 ET JOB#: [id number]  cc: Charlotte Hernandez. Allena KatzPatel, MD, <Dictator> Charlotte CarwinSHREYANG Hernandez Gamal Todisco MD ELECTRONICALLY SIGNED 03/22/2014 13:50

## 2015-03-23 NOTE — Op Note (Signed)
PATIENT NAME:  Charlotte Hernandez, Charlotte Hernandez MR#:  [id number] DATE OF BIRTH:  1966/08/18  DATE OF PROCEDURE:  05/14/2014  PREOPERATIVE DIAGNOSES:  1.  Chronic pelvic pain, left lower quadrant. 2.  Family history of ovarian cancer.  3.  History of multiple abdominal surgeries.   POSTOPERATIVE DIAGNOSES: 1.  Chronic pelvic pain, left lower quadrant. 2.  Family history of ovarian cancer.  3.  History of multiple abdominal surgeries.  4.  Pelvic adhesive disease.   OPERATIVE PROCEDURES:  1.  Laparoscopy with adhesiolysis and bilateral salpingo-oophorectomy.  2.  Cystoscopy.   SURGEON: Herold Harms, M.D.   FIRST ASSISTANT: Dr. Valentino Saxon.   SECOND ASSISTANT: Mancel Bale, PA-S.   ANESTHESIA: General endotracheal.   INDICATIONS: The patient is a 49 year old white female, status post hysterectomy in the past, status post open appendectomy for ruptured appendix, who presents for surgical evaluation and management of chronic left lower quadrant pelvic pain. The patient also has family history of ovarian cancer. She desires BSO.   FINDINGS AT SURGERY: Included dense pelvic adhesive disease with the tubes and ovaries being stuck to the pelvic sidewalls bilaterally. The left a pelvic sidewall wall, colon as well as  small bowel were incorporated in the pelvic adhesive complex. 90 minutes of adhesiolysis was performed to mobilize the tubes and ovaries and excise them.   DESCRIPTION OF PROCEDURE: The patient was brought to the operating room. She was placed in the supine position and general endotracheal anesthesia was induced without difficulty. She was placed in the low lithotomy position using the bumblebee stirrups. A ChloraPrep and Betadine abdominal, perineal, intravaginal prep and drape was performed in standard fashion. A left upper quadrant entry was made due to the history of patient's previous surgical procedures. A 5 mm port was placed in the left upper quadrant 3 fingerbreadths below the costal  margin in the midclavicular line. Towel clamps were used to hold the abdominal wall up. Direct entry was made without evidence of bowel or vascular injury. Pneumoperitoneum was created with CO2 gas. The above-noted findings were photo documented. The 5 mm ports were then placed in the abdomen x 3 including a subumbilical incision, right lower quadrant incision and left lower quadrant incisions, respectively.   Using the Harmonic scalpel, scissors and blunt dissection techniques, the adhesiolysis was performed to mobilize the adnexal structures. Extensive adhesiolysis had to be performed on the left adnexa. The ureters could not be assessed intraoperatively and, therefore, a decision was made to do a cystoscopy post procedure. Once the adnexal structures were adequately mobilized, the infundibulopelvic ligaments were coagulated and cut using the Harmonic scalpel. This was done bilaterally. The adnexal structures were noted were then removed with the aid of an Endo Catch bag. This bag was put through an 11 mm port in the left lower quadrant towards the end of the procedure. Following irrigation of the pelvis and verification of hemostasis, the incisions were closed with the fascial incision being closed with 0 Vicryl on the left lower quadrant. The remaining 5 mm port incisions were closed with Dermabond. Following abdominal closure, the cystoscopy was performed..   The patient was given a 5 mL of methylene blue intravenously. After filling the bladder with 250 mL of lactate Ringer's, visualization of the ureteral orifices were accomplished with evidence of spill from both ureters. Overall inspection of the bladder mucosa appeared normal. The procedure was then terminated with all instrumentation being removed from the bladder and vagina. The patient was then awakened, extubated and taken to  the recovery room in satisfactory condition.   ESTIMATED BLOOD LOSS: Minimal.   IV FLUIDS: 1200 mL.  URINE OUTPUT: 150  mL.  COUNTS: All instrument, needle and sponge counts were verified as correct.   ____________________________ Prentice DockerMartin A. Mikita Lesmeister, MD mad:aw D: 05/14/2014 12:50:36 ET T: 05/14/2014 14:02:51 ET JOB#: [id number]  cc: Daphine DeutscherMartin A. Kamera Dubas, MD, <Dictator> [facility] Prentice DockerMARTIN A Gurnoor Ursua MD ELECTRONICALLY SIGNED 05/30/2014 17:23

## 2015-09-11 DIAGNOSIS — R569 Unspecified convulsions: Secondary | ICD-10-CM | POA: Insufficient documentation

## 2015-09-11 DIAGNOSIS — I10 Essential (primary) hypertension: Secondary | ICD-10-CM | POA: Insufficient documentation

## 2015-09-11 DIAGNOSIS — K219 Gastro-esophageal reflux disease without esophagitis: Secondary | ICD-10-CM | POA: Insufficient documentation

## 2015-10-19 ENCOUNTER — Encounter: Payer: Self-pay | Admitting: Emergency Medicine

## 2015-10-19 ENCOUNTER — Observation Stay
Admission: EM | Admit: 2015-10-19 | Discharge: 2015-10-20 | Disposition: A | Discharge status: Home / Self Care | Payer: BLUE CROSS/BLUE SHIELD | Attending: Internal Medicine | Admitting: Internal Medicine

## 2015-10-19 ENCOUNTER — Emergency Department: Payer: BLUE CROSS/BLUE SHIELD

## 2015-10-19 DIAGNOSIS — I10 Essential (primary) hypertension: Secondary | ICD-10-CM | POA: Diagnosis not present

## 2015-10-19 DIAGNOSIS — R531 Weakness: Secondary | ICD-10-CM | POA: Insufficient documentation

## 2015-10-19 DIAGNOSIS — Z8673 Personal history of transient ischemic attack (TIA), and cerebral infarction without residual deficits: Secondary | ICD-10-CM | POA: Insufficient documentation

## 2015-10-19 DIAGNOSIS — I639 Cerebral infarction, unspecified: Secondary | ICD-10-CM | POA: Diagnosis present

## 2015-10-19 DIAGNOSIS — R200 Anesthesia of skin: Secondary | ICD-10-CM | POA: Diagnosis not present

## 2015-10-19 DIAGNOSIS — E876 Hypokalemia: Secondary | ICD-10-CM | POA: Diagnosis not present

## 2015-10-19 DIAGNOSIS — R569 Unspecified convulsions: Secondary | ICD-10-CM | POA: Diagnosis not present

## 2015-10-19 DIAGNOSIS — R51 Headache: Secondary | ICD-10-CM | POA: Diagnosis not present

## 2015-10-19 DIAGNOSIS — Z7982 Long term (current) use of aspirin: Secondary | ICD-10-CM | POA: Diagnosis not present

## 2015-10-19 DIAGNOSIS — R4781 Slurred speech: Secondary | ICD-10-CM | POA: Insufficient documentation

## 2015-10-19 DIAGNOSIS — F17210 Nicotine dependence, cigarettes, uncomplicated: Secondary | ICD-10-CM | POA: Diagnosis not present

## 2015-10-19 DIAGNOSIS — M50322 Other cervical disc degeneration at C5-C6 level: Secondary | ICD-10-CM | POA: Insufficient documentation

## 2015-10-19 DIAGNOSIS — M47812 Spondylosis without myelopathy or radiculopathy, cervical region: Secondary | ICD-10-CM | POA: Insufficient documentation

## 2015-10-19 DIAGNOSIS — M542 Cervicalgia: Secondary | ICD-10-CM

## 2015-10-19 DIAGNOSIS — R29810 Facial weakness: Secondary | ICD-10-CM | POA: Diagnosis not present

## 2015-10-19 DIAGNOSIS — R208 Other disturbances of skin sensation: Secondary | ICD-10-CM | POA: Diagnosis present

## 2015-10-19 DIAGNOSIS — Z79899 Other long term (current) drug therapy: Secondary | ICD-10-CM | POA: Insufficient documentation

## 2015-10-19 DIAGNOSIS — K219 Gastro-esophageal reflux disease without esophagitis: Secondary | ICD-10-CM | POA: Diagnosis not present

## 2015-10-19 DIAGNOSIS — M50323 Other cervical disc degeneration at C6-C7 level: Secondary | ICD-10-CM | POA: Insufficient documentation

## 2015-10-19 HISTORY — DX: Transient cerebral ischemic attack, unspecified: G45.9

## 2015-10-19 HISTORY — DX: Unspecified convulsions: R56.9

## 2015-10-19 LAB — URINE DRUG SCREEN, QUALITATIVE (ARMC ONLY)
AMPHETAMINES, UR SCREEN: NOT DETECTED
BENZODIAZEPINE, UR SCRN: NOT DETECTED
Barbiturates, Ur Screen: NOT DETECTED
COCAINE METABOLITE, UR ~~LOC~~: NOT DETECTED
Cannabinoid 50 Ng, Ur ~~LOC~~: NOT DETECTED
MDMA (Ecstasy)Ur Screen: NOT DETECTED
METHADONE SCREEN, URINE: NOT DETECTED
OPIATE, UR SCREEN: NOT DETECTED
PHENCYCLIDINE (PCP) UR S: NOT DETECTED
Tricyclic, Ur Screen: NOT DETECTED

## 2015-10-19 LAB — URINALYSIS COMPLETE WITH MICROSCOPIC (ARMC ONLY)
BACTERIA UA: NONE SEEN
Bilirubin Urine: NEGATIVE
GLUCOSE, UA: NEGATIVE mg/dL
Hgb urine dipstick: NEGATIVE
Ketones, ur: NEGATIVE mg/dL
Leukocytes, UA: NEGATIVE
Nitrite: NEGATIVE
PROTEIN: NEGATIVE mg/dL
Specific Gravity, Urine: 1.019 (ref 1.005–1.030)
pH: 6 (ref 5.0–8.0)

## 2015-10-19 LAB — COMPREHENSIVE METABOLIC PANEL
ALT: 18 U/L (ref 14–54)
AST: 20 U/L (ref 15–41)
Albumin: 4.1 g/dL (ref 3.5–5.0)
Alkaline Phosphatase: 73 U/L (ref 38–126)
Anion gap: 8 (ref 5–15)
BUN: 18 mg/dL (ref 6–20)
CHLORIDE: 104 mmol/L (ref 101–111)
CO2: 26 mmol/L (ref 22–32)
CREATININE: 0.79 mg/dL (ref 0.44–1.00)
Calcium: 9.1 mg/dL (ref 8.9–10.3)
GFR calc non Af Amer: >60 mL/min (ref >60)
Glucose, Bld: 103 mg/dL — ABNORMAL HIGH (ref 65–99)
POTASSIUM: 3 mmol/L — AB (ref 3.5–5.1)
SODIUM: 138 mmol/L (ref 135–145)
Total Bilirubin: 0.4 mg/dL (ref 0.3–1.2)
Total Protein: 7.5 g/dL (ref 6.5–8.1)

## 2015-10-19 LAB — CBC
HEMATOCRIT: 40.2 % (ref 35.0–47.0)
Hemoglobin: 13.9 g/dL (ref 12.0–16.0)
MCH: 33.7 pg (ref 26.0–34.0)
MCHC: 34.6 g/dL (ref 32.0–36.0)
MCV: 97.5 fL (ref 80.0–100.0)
PLATELETS: 273 10*3/uL (ref 150–440)
RBC: 4.12 MIL/uL (ref 3.80–5.20)
RDW: 14.7 % — ABNORMAL HIGH (ref 11.5–14.5)
WBC: 6.6 10*3/uL (ref 3.6–11.0)

## 2015-10-19 LAB — PROTIME-INR
INR: 0.8
Prothrombin Time: 11.3 seconds — ABNORMAL LOW (ref 11.4–15.0)

## 2015-10-19 LAB — ETHANOL: Alcohol, Ethyl (B): 8 mg/dL — ABNORMAL HIGH (ref <5)

## 2015-10-19 LAB — APTT: APTT: 27 s (ref 24–36)

## 2015-10-19 LAB — TROPONIN I: Troponin I: <0.03 ng/mL (ref <0.031)

## 2015-10-19 MED ORDER — VITAMIN B-12 1000 MCG PO TABS
1000.0000 ug | ORAL_TABLET | Freq: Every day | ORAL | Status: DC
  Administered 2015-10-20: 11:00:00 1000 ug via ORAL
  Filled 2015-10-19 (×2): qty 1

## 2015-10-19 MED ORDER — ACETAMINOPHEN 650 MG RE SUPP
650.0000 mg | Freq: Four times a day (QID) | RECTAL | Status: DC | PRN
  Administered 2015-10-19: 650 mg via RECTAL
  Filled 2015-10-19: qty 1

## 2015-10-19 MED ORDER — ASPIRIN EC 81 MG PO TBEC
81.0000 mg | DELAYED_RELEASE_TABLET | Freq: Every day | ORAL | Status: DC
  Filled 2015-10-19: qty 1

## 2015-10-19 MED ORDER — ASPIRIN 81 MG PO CHEW: 243.0000 mg | CHEWABLE_TABLET | Freq: Once | ORAL | Status: DC

## 2015-10-19 MED ORDER — BISACODYL 10 MG RE SUPP: 10.0000 mg | Freq: Every day | RECTAL | Status: DC | PRN

## 2015-10-19 MED ORDER — PANTOPRAZOLE SODIUM 40 MG PO TBEC
40.0000 mg | DELAYED_RELEASE_TABLET | Freq: Every day | ORAL | Status: DC
  Administered 2015-10-20: 40 mg via ORAL
  Filled 2015-10-19 (×2): qty 1

## 2015-10-19 MED ORDER — LORAZEPAM 2 MG/ML IJ SOLN: 1.0000 mg | INTRAMUSCULAR | Status: DC | PRN

## 2015-10-19 MED ORDER — ONDANSETRON HCL 4 MG PO TABS: 4.0000 mg | ORAL_TABLET | Freq: Four times a day (QID) | ORAL | Status: DC | PRN

## 2015-10-19 MED ORDER — SODIUM CHLORIDE 0.9 % IJ SOLN
3.0000 mL | INTRAMUSCULAR | Status: DC | PRN
  Administered 2015-10-19 – 2015-10-20 (×2): 3 mL via INTRAVENOUS
  Filled 2015-10-19 (×2): qty 10

## 2015-10-19 MED ORDER — VITAMIN B-12 ER 1000 MCG PO TBCR: 1000.0000 ug | EXTENDED_RELEASE_TABLET | Freq: Every day | ORAL | Status: DC

## 2015-10-19 MED ORDER — SODIUM CHLORIDE 0.9 % IJ SOLN
3.0000 mL | Freq: Two times a day (BID) | INTRAMUSCULAR | Status: DC
  Administered 2015-10-20: 3 mL via INTRAVENOUS

## 2015-10-19 MED ORDER — SODIUM CHLORIDE 0.9 % IV SOLN: 250.0000 mL | INTRAVENOUS | Status: DC | PRN

## 2015-10-19 MED ORDER — SODIUM CHLORIDE 0.9 % IV SOLN
500.0000 mg | Freq: Two times a day (BID) | INTRAVENOUS | Status: DC
  Administered 2015-10-20: 500 mg via INTRAVENOUS
  Filled 2015-10-19 (×3): qty 5

## 2015-10-19 MED ORDER — DOCUSATE SODIUM 100 MG PO CAPS
100.0000 mg | ORAL_CAPSULE | Freq: Two times a day (BID) | ORAL | Status: DC
  Administered 2015-10-20: 100 mg via ORAL
  Filled 2015-10-19 (×2): qty 1

## 2015-10-19 MED ORDER — POTASSIUM CHLORIDE 10 MEQ/100ML IV SOLN
10.0000 meq | INTRAVENOUS | Status: DC
  Administered 2015-10-19 (×2): 10 meq via INTRAVENOUS
  Filled 2015-10-19 (×4): qty 100

## 2015-10-19 MED ORDER — HEPARIN SODIUM (PORCINE) 5000 UNIT/ML IJ SOLN
5000.0000 [IU] | Freq: Three times a day (TID) | INTRAMUSCULAR | Status: DC
  Administered 2015-10-19 – 2015-10-20 (×3): 5000 [IU] via SUBCUTANEOUS
  Filled 2015-10-19 (×3): qty 1

## 2015-10-19 MED ORDER — HYDROCODONE-ACETAMINOPHEN 5-325 MG PO TABS
1.0000 | ORAL_TABLET | ORAL | Status: DC | PRN
  Administered 2015-10-20 (×2): 1 via ORAL
  Filled 2015-10-19 (×2): qty 1
  Filled 2015-10-19: qty 2

## 2015-10-19 MED ORDER — POTASSIUM CHLORIDE IN NACL 20-0.9 MEQ/L-% IV SOLN
INTRAVENOUS | Status: DC
  Administered 2015-10-20: 01:00:00 via INTRAVENOUS
  Filled 2015-10-19 (×3): qty 1000

## 2015-10-19 MED ORDER — TOPIRAMATE 100 MG PO TABS
100.0000 mg | ORAL_TABLET | Freq: Two times a day (BID) | ORAL | Status: DC
  Administered 2015-10-20: 100 mg via ORAL
  Filled 2015-10-19 (×3): qty 1

## 2015-10-19 MED ORDER — HYDROCHLOROTHIAZIDE 25 MG PO TABS
25.0000 mg | ORAL_TABLET | Freq: Every day | ORAL | Status: DC
  Filled 2015-10-19: qty 1

## 2015-10-19 MED ORDER — ACETAMINOPHEN 325 MG PO TABS: 650.0000 mg | ORAL_TABLET | Freq: Four times a day (QID) | ORAL | Status: DC | PRN

## 2015-10-19 MED ORDER — SODIUM CHLORIDE 0.9 % IJ SOLN
3.0000 mL | Freq: Two times a day (BID) | INTRAMUSCULAR | Status: DC
  Administered 2015-10-19: 3 mL via INTRAVENOUS

## 2015-10-19 MED ORDER — ONDANSETRON HCL 4 MG/2ML IJ SOLN: 4.0000 mg | Freq: Four times a day (QID) | INTRAMUSCULAR | Status: DC | PRN

## 2015-10-19 NOTE — ED Notes (Signed)
States began facial numbness 3 hours ago, progressed to R facial weakness, both grips equal, holds both arms up without drift

## 2015-10-19 NOTE — ED Notes (Signed)
Admitting MD at bedside.

## 2015-10-19 NOTE — ED Notes (Addendum)
Pt c/o eyes twitching and numbness to face and mouth.  Pt had a "mini stroke" a few months ago with similar sxs.  Lip numbness started around 3pm.  Pt currently takes Topamax and does not follow up with a neurologist.  Pt does have some expressive aphasia and right facial droop noticeable. Pt has been ambulatory but noticed increased weakness

## 2015-10-19 NOTE — H&P (Signed)
History and Physical    BRIGETTE HOPFER WUJ:[id number] DOB: 1966/08/12 DOA: 10/19/2015  Referring physician: Dr. Lenard Lance PCP: No primary care provider on file.  Specialists: none  Chief Complaint: facial numbness  HPI: Charlotte Hernandez is a 49 y.o. female has a past medical history significant for siezures now with bilateral facial numbness, left>right. Had similar episode 2 years ago and was told she had a "TIA". Also some mild LUE weakness. No fever. Denies HA or visual changes. No seizures. Head CT non-acute. She is now admitted for further evaluation.  Review of Systems: The patient denies anorexia, fever, weight loss,, vision loss, decreased hearing, hoarseness, chest pain, syncope, dyspnea on exertion, peripheral edema, balance deficits, hemoptysis, abdominal pain, melena, hematochezia, severe indigestion/heartburn, hematuria, incontinence, genital sores, muscle weakness, suspicious skin lesions, transient blindness, difficulty walking, depression, unusual weight change, abnormal bleeding, enlarged lymph nodes, angioedema, and breast masses.   Past Medical History  Diagnosis Date  . Seizures (HCC)   . TIA (transient ischemic attack)    Past Surgical History  Procedure Laterality Date  . Abdominal hysterectomy    . Appendectomy     Social History:  reports that she has been smoking Cigarettes.  She has been smoking about 1.00 pack per day. She does not have any smokeless tobacco history on file. She reports that she drinks alcohol. Her drug history is not on file.  No Known Allergies  History reviewed. No pertinent family history.  Prior to Admission medications   Medication Sig Start Date End Date Taking? Authorizing Provider  aspirin EC 81 MG tablet Take 81 mg by mouth daily.   Yes Historical Provider, MD  Cyanocobalamin (VITAMIN B-12 CR) 1000 MCG TBCR Take 1,000 mcg by mouth daily.   Yes Historical Provider, MD  hydrochlorothiazide (HYDRODIURIL) 25 MG tablet Take 25  mg by mouth daily. 09/11/15  Yes Historical Provider, MD  omeprazole (PRILOSEC) 40 MG capsule Take 40 mg by mouth daily. 10/14/15  Yes Historical Provider, MD  phentermine (ADIPEX-P) 37.5 MG tablet Take 37.5 mg by mouth daily before breakfast.   Yes Historical Provider, MD  promethazine (PHENERGAN) 25 MG tablet Take 25 mg by mouth every 6 (six) hours as needed. For nausea. 04/25/14  Yes Historical Provider, MD  topiramate (TOPAMAX) 100 MG tablet Take 100 mg by mouth 2 (two) times daily. 09/11/15  Yes Historical Provider, MD   Physical Exam: Filed Vitals:   10/19/15 1847 10/19/15 1848 10/19/15 1909 10/19/15 1955  BP: 135/89  113/65   Pulse:   95   Temp:    98.8 F (37.1 C)  TempSrc:      Resp:  12 16   Height:      Weight:      SpO2:   96%      General:  No apparent distress  Eyes: PERRL, EOMI, no scleral icterus  ENT: moist oropharynx  Neck: supple, no lymphadenopathy  Cardiovascular: regular rate without MRG; 2+ peripheral pulses, no JVD, no peripheral edema  Respiratory: CTA biL, good air movement without wheezing, rhonchi or crackled  Abdomen: soft, non tender to palpation, positive bowel sounds, no guarding, no rebound  Skin: no rashes  Musculoskeletal: normal bulk and tone, no joint swelling  Psychiatric: normal mood and affect  Neurologic: CN 2-12 grossly intact, MS 5/5 in all 4  Labs on Admission:  Basic Metabolic Panel:  Recent Labs Lab 10/19/15 1855  NA 138  K 3.0*  CL 104  CO2 26  GLUCOSE 103*  BUN 18  CREATININE 0.79  CALCIUM 9.1   Liver Function Tests:  Recent Labs Lab 10/19/15 1855  AST 20  ALT 18  ALKPHOS 73  BILITOT 0.4  PROT 7.5  ALBUMIN 4.1   No results for input(s): LIPASE, AMYLASE in the last 168 hours. No results for input(s): AMMONIA in the last 168 hours. CBC:  Recent Labs Lab 10/19/15 1855  WBC 6.6  HGB 13.9  HCT 40.2  MCV 97.5  PLT 273   Cardiac Enzymes:  Recent Labs Lab 10/19/15 1855  TROPONINI <0.03     BNP (last 3 results) No results for input(s): BNP in the last 8760 hours.  ProBNP (last 3 results) No results for input(s): PROBNP in the last 8760 hours.  CBG: No results for input(s): GLUCAP in the last 168 hours.  Radiological Exams on Admission: Ct Head Wo Contrast  10/19/2015  CLINICAL DATA:  Acute onset right-sided weakness and facial droop 4 hours ago. Code stroke. EXAM: CT HEAD WITHOUT CONTRAST TECHNIQUE: Contiguous axial images were obtained from the base of the skull through the vertex without intravenous contrast. COMPARISON:  03/19/14 FINDINGS: Brain: No evidence of acute infarction, hemorrhage, extra-axial collection, ventriculomegaly, or mass effect. Vascular: No hyperdense vessel or unexpected calcification. Skull: Negative for fracture or focal lesion. Sinuses/Orbits: No acute findings. Other: None. IMPRESSION: Negative noncontrast head CT. These results were called by telephone at the time of interpretation on 10/19/2015 at 7:07 pm to Dr. Minna AntisKEVIN PADUCHOWSKI , who verbally acknowledged these results. Electronically Signed   By: Myles RosenthalJohn  Stahl M.D.   On: 10/19/2015 19:08    EKG: Independently reviewed.  Assessment/Plan Active Problems:   Facial numbness   Will observe on the floor with telemetry. Neuro checks q4h. Order MRI of brain and Neurology consult. Supplement K+. Repeat labs in AM.  Diet: soft Fluids: saline lock DVT Prophylaxis: SQ Heparin  Code Status: FULL  Family Communication: yes  Disposition Plan: home  Time spent: 45 min

## 2015-10-19 NOTE — ED Notes (Signed)
Patient transported to CT 

## 2015-10-19 NOTE — ED Provider Notes (Signed)
[facility] Emergency Department Provider Note  Time seen: 6:53 PM  I have reviewed the triage vital signs and the nursing notes.   HISTORY  Chief Complaint Weakness    HPI Charlotte Hernandez is a 49 y.o. female with a past medical history of epilepsy, TIAs, gastric reflux, who presents the emergency department with left facial and arm numbness. According to the patient around 3 PM she began having numbness of the left side of her lip, she states shortly after she began having difficulty speaking and felt that the left side of her face was numb as well as her left arm. Denies any trouble walking, denies confusion, denies headache. Patient states she has had this happen twice in the past, once 2 years ago she was admitted to the hospital for similar symptoms ultimately diagnosed with a TIA and discharged home. It happened one more time after that however symptoms were brief and resolved before the patient to get to the hospital. Denies any chest pain, abdominal pain. Denies any headache.     Past Medical History  Diagnosis Date  . Seizures (HCC)   . TIA (transient ischemic attack)     There are no active problems to display for this patient.   Past Surgical History  Procedure Laterality Date  . Abdominal hysterectomy    . Appendectomy      No current outpatient prescriptions on file.  Allergies Review of patient's allergies indicates no known allergies.  No family history on file.  Social History Social History  Substance Use Topics  . Smoking status: Current Every Day Smoker -- 1.00 packs/day    Types: Cigarettes  . Smokeless tobacco: None  . Alcohol Use: Yes    Review of Systems Constitutional: Negative for fever. Positive for trouble speaking. Eyes: Negative for visual changes. Positive left facial numbness Cardiovascular: Negative for chest pain. Respiratory: Negative for shortness of breath. Gastrointestinal: Negative for abdominal  pain Musculoskeletal: Negative for back pain. Neurological: Negative for headache. Positive for trouble speaking. Positive for left facial and arm numbness. Denies weakness. 10-point ROS otherwise negative.  ____________________________________________   PHYSICAL EXAM:  VITAL SIGNS: ED Triage Vitals  Enc Vitals Group     BP 10/19/15 1837 148/93 mmHg     Pulse Rate 10/19/15 1837 101     Resp 10/19/15 1837 20     Temp 10/19/15 1837 97.9 F (36.6 C)     Temp Source 10/19/15 1837 Oral     SpO2 10/19/15 1837 96 %     Weight 10/19/15 1837 174 lb (78.926 kg)     Height 10/19/15 1837  (1.626 m)     Head Cir --      Peak Flow --      Pain Score --      Pain Loc --      Pain Edu? --      Excl. in GC? --     Constitutional: Alert and oriented. Well appearing and in no distress. Eyes: Normal exam ENT   Head: atraumatic.   Mouth/Throat: Mucous membranes are moist. Cardiovascular: Normal rate, regular rhythm. No murmur Respiratory: Normal respiratory effort without tachypnea nor retractions. Breath sounds are clear Gastrointestinal: Soft and nontender. No distention. Musculoskeletal: Nontender with normal range of motion in all extremities.  Neurologic:  Patient has a left facial tensing, not so much a droop. Equal grip strengths, equal motor. No drift. Subjective numbness of the left upper extremity and left face. Recent lower extremity knee  surgery, difficult to evaluate strength, but sensation is equal. Somewhat of slurred speech likely due to facial numbness. Skin:  Skin is warm, dry and intact.  Psychiatric: Mood and affect are normal.  ____________________________________________    EKG  EKG reviewed and interpreted by myself shows what appears to be an accelerated junctional rhythm at 104 bpm, slightly widened QRS, normal axis nonspecific ST changes. No ST elevations.  ____________________________________________    RADIOLOGY  CT shows no acute  abnormality   INITIAL IMPRESSION / ASSESSMENT AND PLAN / ED COURSE  Pertinent labs & imaging results that were available during my care of the patient were reviewed by me and considered in my medical decision making (see chart for details).  Patient presents with left-sided facial numbness and arm numbness times almost 4 hours. States similar symptoms twice in the past ultimately diagnosed with a mini stroke. Given the onset of symptoms 4 hours ago, we have initiated code stroke protocol.  I discussed with the neurologist on-call at [facility] count. He is requesting a stat ER to ER transfer to [facility] console the patient can be evaluated by neurology in person.  The neurologist has called back and stated that was incorrect, and to proceed with [facility] evaluation.  If tPA is used they will accept, otherwise pt to be managed at [facility].  CT negative, labs are largely within normal limits besides an ethanol level of 8. As his CBC has seen the patient, they believe the patient's symptoms are somewhat atypical, recommended admission for stroke workup with neurology consult. Patient will be admitted.   NIH Stroke Scale   Interval: 7 PM, baseline Time: 7:04 PM Person Administering Scale: Jesseca Marsch  Administer stroke scale items in the order listed. Record performance in each category after each subscale exam. Do not go back and change scores. Follow directions provided for each exam technique. Scores should reflect what the patient does, not what the clinician thinks the patient can do. The clinician should record answers while administering the exam and work quickly. Except where indicated, the patient should not be coached (i.e., repeated requests to patient to make a special effort).   1a  Level of consciousness: 0=alert; keenly responsive  1b. LOC questions:  0=Performs both tasks correctly  1c. LOC commands: 0=Performs both tasks correctly  2.  Best Gaze: 0=normal  3.  Visual: 0=No visual  loss  4. Facial Palsy: 1=Minor paralysis (flattened nasolabial fold, asymmetric on smiling)  5a.  Motor left arm: 0=No drift, limb holds 90 (or 45) degrees for full 10 seconds  5b.  Motor right arm: 0=No drift, limb holds 90 (or 45) degrees for full 10 seconds  6a. motor left leg: 0=No drift, limb holds 90 (or 45) degrees for full 10 seconds  6b  Motor right leg:  0=No drift, limb holds 90 (or 45) degrees for full 10 seconds  7. Limb Ataxia: 0=Absent  8.  Sensory: 1=Mild to moderate sensory loss; patient feels pinprick is less sharp or is dull on the affected side; there is a loss of superficial pain with pinprick but patient is aware She is being touched  9. Best Language:  0=No aphasia, normal  10. Dysarthria: 1=Mild to moderate, patient slurs at least some words and at worst, can be understood with some difficulty  11. Extinction and Inattention: 0=No abnormality  12. Distal motor function: 0=Normal   Total:   3    ____________________________________________   FINAL CLINICAL IMPRESSION(S) / ED DIAGNOSES  CVA  Minna AntisKevin Magdiel Bartles, MD 10/19/15 2028

## 2015-10-19 NOTE — ED Notes (Addendum)
Neurologist SOC in progress.

## 2015-10-20 ENCOUNTER — Observation Stay: Payer: BLUE CROSS/BLUE SHIELD

## 2015-10-20 DIAGNOSIS — R569 Unspecified convulsions: Secondary | ICD-10-CM

## 2015-10-20 LAB — COMPREHENSIVE METABOLIC PANEL
ALBUMIN: 3.5 g/dL (ref 3.5–5.0)
ALT: 14 U/L (ref 14–54)
AST: 15 U/L (ref 15–41)
Alkaline Phosphatase: 61 U/L (ref 38–126)
Anion gap: 5 (ref 5–15)
BUN: 18 mg/dL (ref 6–20)
CHLORIDE: 110 mmol/L (ref 101–111)
CO2: 25 mmol/L (ref 22–32)
Calcium: 8.4 mg/dL — ABNORMAL LOW (ref 8.9–10.3)
Creatinine, Ser: 0.63 mg/dL (ref 0.44–1.00)
GFR calc Af Amer: >60 mL/min (ref >60)
Glucose, Bld: 101 mg/dL — ABNORMAL HIGH (ref 65–99)
POTASSIUM: 3 mmol/L — AB (ref 3.5–5.1)
SODIUM: 140 mmol/L (ref 135–145)
Total Bilirubin: 0.6 mg/dL (ref 0.3–1.2)
Total Protein: 6.1 g/dL — ABNORMAL LOW (ref 6.5–8.1)

## 2015-10-20 LAB — BASIC METABOLIC PANEL
ANION GAP: 6 (ref 5–15)
BUN: 11 mg/dL (ref 6–20)
CALCIUM: 8.8 mg/dL — AB (ref 8.9–10.3)
CO2: 22 mmol/L (ref 22–32)
Chloride: 111 mmol/L (ref 101–111)
Creatinine, Ser: 0.59 mg/dL (ref 0.44–1.00)
Glucose, Bld: 103 mg/dL — ABNORMAL HIGH (ref 65–99)
POTASSIUM: 4.2 mmol/L (ref 3.5–5.1)
Sodium: 139 mmol/L (ref 135–145)

## 2015-10-20 LAB — CBC
HEMATOCRIT: 35.2 % (ref 35.0–47.0)
Hemoglobin: 11.9 g/dL — ABNORMAL LOW (ref 12.0–16.0)
MCH: 33.6 pg (ref 26.0–34.0)
MCHC: 33.8 g/dL (ref 32.0–36.0)
MCV: 99.3 fL (ref 80.0–100.0)
PLATELETS: 208 10*3/uL (ref 150–440)
RBC: 3.54 MIL/uL — ABNORMAL LOW (ref 3.80–5.20)
RDW: 14.9 % — AB (ref 11.5–14.5)
WBC: 4.4 10*3/uL (ref 3.6–11.0)

## 2015-10-20 LAB — MAGNESIUM: Magnesium: 2 mg/dL (ref 1.7–2.4)

## 2015-10-20 LAB — SEDIMENTATION RATE: SED RATE: 17 mm/h (ref 0–20)

## 2015-10-20 MED ORDER — BUTALBITAL-APAP-CAFFEINE 50-325-40 MG PO TABS: 1.0000 | ORAL_TABLET | Freq: Four times a day (QID) | ORAL | Status: DC | PRN

## 2015-10-20 MED ORDER — ASPIRIN 81 MG PO CHEW
81.0000 mg | CHEWABLE_TABLET | Freq: Every day | ORAL | Status: DC
  Administered 2015-10-20: 11:00:00 81 mg via ORAL
  Filled 2015-10-20: qty 1

## 2015-10-20 MED ORDER — CARBAMAZEPINE 200 MG PO TABS: 200.0000 mg | ORAL_TABLET | Freq: Two times a day (BID) | ORAL | Status: DC

## 2015-10-20 MED ORDER — POTASSIUM CHLORIDE CRYS ER 20 MEQ PO TBCR
40.0000 meq | EXTENDED_RELEASE_TABLET | ORAL | Status: DC
Start: 2015-10-20 — End: 2015-10-20
  Filled 2015-10-20: qty 2

## 2015-10-20 MED ORDER — LACOSAMIDE 50 MG PO TABS
50.0000 mg | ORAL_TABLET | Freq: Two times a day (BID) | ORAL | Status: DC
Start: 2015-10-20 — End: 2015-10-20
  Administered 2015-10-20: 14:00:00 50 mg via ORAL
  Filled 2015-10-20: qty 1

## 2015-10-20 MED ORDER — POTASSIUM CHLORIDE 10 MEQ/100ML IV SOLN
10.0000 meq | INTRAVENOUS | Status: DC
  Administered 2015-10-20: 11:00:00 10 meq via INTRAVENOUS
  Filled 2015-10-20 (×4): qty 100

## 2015-10-20 MED ORDER — LACOSAMIDE 50 MG PO TABS: 50.0000 mg | ORAL_TABLET | Freq: Two times a day (BID) | ORAL | Status: DC

## 2015-10-20 MED ORDER — SODIUM CHLORIDE 0.9 % IV BOLUS (SEPSIS)
1000.0000 mL | Freq: Once | INTRAVENOUS | Status: AC
  Administered 2015-10-20: 1000 mL via INTRAVENOUS

## 2015-10-20 MED ORDER — ASPIRIN 300 MG RE SUPP
150.0000 mg | Freq: Every day | RECTAL | Status: DC
  Filled 2015-10-20 (×2): qty 1

## 2015-10-20 MED ORDER — POTASSIUM CHLORIDE CRYS ER 20 MEQ PO TBCR
40.0000 meq | EXTENDED_RELEASE_TABLET | ORAL | Status: AC
  Administered 2015-10-20 (×2): 40 meq via ORAL
  Filled 2015-10-20 (×2): qty 2

## 2015-10-20 MED ORDER — MORPHINE SULFATE (PF) 2 MG/ML IV SOLN
2.0000 mg | INTRAVENOUS | Status: DC | PRN
  Administered 2015-10-20 (×2): 2 mg via INTRAVENOUS
  Filled 2015-10-20 (×2): qty 1

## 2015-10-20 MED ORDER — GADOBENATE DIMEGLUMINE 529 MG/ML IV SOLN
15.0000 mL | Freq: Once | INTRAVENOUS | Status: AC | PRN
  Administered 2015-10-20: 10:00:00 15 mL via INTRAVENOUS

## 2015-10-20 NOTE — Plan of Care (Signed)
Problem: Nutrition: Goal: Dietary intake will improve Outcome: Progressing Pt alert and oriented. Left numbness on her left side. MD aware and notified, one liter of normal sailing bolus per MD. Spouse at bedside. Tylenol suppository for headache with no relief. Morphine for headache with some relief. SR on monitor. Continue to monitor.

## 2015-10-20 NOTE — Plan of Care (Signed)
Problem: Education: Goal: Knowledge of [facility] Education information/materials will improve Outcome: Completed/Met Date Met:  10/20/15 General education completed  Problem: Safety: Goal: Ability to remain free from injury will improve Outcome: Progressing No injury/falls this shift thus far.  Problem: Pain Managment: Goal: General experience of comfort will improve Outcome: Progressing Reports HA easing and down to "3"/10 currently. Morphine IV X 1 when pt NPO for MRI with minimal relief. Increased relief by hydrocodone/acetaminophen x 1 of HA. Pt verbalized adequate pain control currently.   Problem: Activity: Goal: Risk for activity intolerance will decrease Outcome: Progressing Up to BR with SB+ and tolerated well. Napped at intervals. No problems with activity.  Problem: Education: Goal: Knowledge of disease or condition will improve Outcome: Progressing No complications. Stroke evaluation indicating negative thus far. Dr. Volanda Napoleon considering complicated migraine. CS xrays done. Goal: Knowledge of secondary prevention will improve Outcome: Completed/Met Date Met:  10/20/15 Educated on 911 with warning signs/symptom education done, information on hand. Goal: Knowledge of patient specific risk factors addressed and post discharge goals established will improve Outcome: Progressing Educated on risk factors of elevated blood pressure, ETOH/Drug use, elevated cholesterol- stated understanding.  Problem: Coping: Goal: Ability to verbalize positive feelings about self will improve Outcome: Progressing Positive reinforcement for progress and communication done.  Problem: Nutrition: Goal: Risk of aspiration will decrease Outcome: Progressing Speech consult done with aspiration precautions done. Pt placed on dysphagia 1 diet with thin liquids. Pt swallowing whole pills including K+ with applesauce with no difficulty. Swallowing food/beverage without difficulty. Pt asking  for different diet with education done. Meds adjusted according to swallowing status today. Pt sits up to eat/swallow.

## 2015-10-20 NOTE — Progress Notes (Signed)
[facility] [place] [facility]   PATIENT NAME: Charlotte Hernandez    MR#:  [id number]  DATE OF BIRTH:  06/02/1966  SUBJECTIVE:  CHIEF COMPLAINT:   Chief Complaint  Patient presents with  . Weakness    R facial weakness, states began with mouth numbness 3 hours ago   Complains of left neck pain and frontal headache.  REVIEW OF SYSTEMS:   Review of Systems  Constitutional: Negative for fever.  Respiratory: Negative for shortness of breath.   Cardiovascular: Negative for chest pain and palpitations.  Gastrointestinal: Negative for nausea, vomiting and abdominal pain.  Genitourinary: Negative for dysuria.    DRUG ALLERGIES:  No Known Allergies  VITALS:  Blood pressure 97/59, pulse 70, temperature 97.8 F (36.6 C), temperature source Oral, resp. rate 18, height 5\' 4"  (1.626 m), weight 77.111 kg (170 lb), SpO2 98 %.  PHYSICAL EXAMINATION:  GENERAL:  49 y.o.-year-old patient lying in the bed, uncomfortable  EYES: Pupils equal, round, reactive to light and accommodation. No scleral icterus. Extraocular muscles intact.  HEENT: Head atraumatic, normocephalic. Oropharynx and nasopharynx clear. Mucous membranes are moist NECK:  Supple, no jugular venous distention. No thyroid enlargement, no tenderness. Some tenderness on the posterior and left side of the neck, full range of motion LUNGS: Normal breath sounds bilaterally, no wheezing, rales,rhonchi or crepitation. No use of accessory muscles of respiration.  CARDIOVASCULAR: S1, S2 normal. No murmurs, rubs, or gallops.  ABDOMEN: Soft, nontender, nondistended. Bowel sounds present. No organomegaly or mass.  EXTREMITIES: No pedal edema, cyanosis, or clubbing.  NEUROLOGIC: Cranial nerves II through XII are intact. Muscle strength 5/5 in all extremities. Sensation intact. Gait not checked.  PSYCHIATRIC: The patient is alert and oriented x 3. Flat affect. Seems uncomfortable and depressed SKIN: No obvious rash,  lesion, or ulcer.    LABORATORY PANEL:   CBC  Recent Labs Lab 10/20/15 0403  WBC 4.4  HGB 11.9*  HCT 35.2  PLT 208   ------------------------------------------------------------------------------------------------------------------  Chemistries   Recent Labs Lab 10/20/15 0403  NA 140  K 3.0*  CL 110  CO2 25  GLUCOSE 101*  BUN 18  CREATININE 0.63  CALCIUM 8.4*  MG 2.0  AST 15  ALT 14  ALKPHOS 61  BILITOT 0.6   ------------------------------------------------------------------------------------------------------------------  Cardiac Enzymes  Recent Labs Lab 10/19/15 1855  TROPONINI <0.03   ------------------------------------------------------------------------------------------------------------------  RADIOLOGY:  Ct Head Wo Contrast  10/19/2015  CLINICAL DATA:  Acute onset right-sided weakness and facial droop 4 hours ago. Code stroke. EXAM: CT HEAD WITHOUT CONTRAST TECHNIQUE: Contiguous axial images were obtained from the base of the skull through the vertex without intravenous contrast. COMPARISON:  03/19/14 FINDINGS: Brain: No evidence of acute infarction, hemorrhage, extra-axial collection, ventriculomegaly, or mass effect. Vascular: No hyperdense vessel or unexpected calcification. Skull: Negative for fracture or focal lesion. Sinuses/Orbits: No acute findings. Other: None. IMPRESSION: Negative noncontrast head CT. These results were called by telephone at the time of interpretation on 10/19/2015 at 7:07 pm to Dr. Minna AntisKEVIN PADUCHOWSKI , who verbally acknowledged these results. Electronically Signed   By: Myles RosenthalJohn  Stahl M.D.   On: 10/19/2015 19:08   Mr Laqueta JeanBrain W WGWo Contrast  10/20/2015  CLINICAL DATA:  Facial numbness left greater than right EXAM: MRI HEAD WITHOUT AND WITH CONTRAST TECHNIQUE: Multiplanar, multiecho pulse sequences of the brain and surrounding structures were obtained without and with intravenous contrast. CONTRAST:  15mL MULTIHANCE GADOBENATE  DIMEGLUMINE 529 MG/ML IV SOLN COMPARISON:  CT head 10/19/2015 FINDINGS: Ventricle  size normal. Cerebral volume normal. Pituitary normal in size. Cervical medullary junction normal. Negative for acute or chronic ischemia. Negative for demyelinating disease. Normal white matter. Normal basal ganglia and brainstem. Negative for hemorrhage or fluid collection. Negative for mass or edema.  No shift of the midline structures. Postcontrast imaging reveals normal enhancement. Negative for enhancing mass. Leptomeningeal enhancement is normal. Vascular enhancement is normal. Paranasal sinuses clear.  Orbit is normal. IMPRESSION: Normal contrast-enhanced MRI of the head. Electronically Signed   By: Marlan Palau M.D.   On: 10/20/2015 10:12    EKG:   Orders placed or performed during the hospital encounter of 10/19/15  . EKG 12-Lead  . EKG 12-Lead  . ED EKG  . ED EKG    ASSESSMENT AND PLAN:   #1 facial numbness, left sided neck pain: CT and MRI are negative. She reports symptoms consistent with either a complex migraine or muscle tension headache. Neurology consultation is pending.  #2 hypokalemia: Replacing. Magnesium is normal. This may be contributing to some of her muscle spasm and discomfort. Stop hydrochlorothiazide as this is likely contributing  #3 history of seizures: No seizure activity at this time. ContinueTopamax  #4 GERD:continue PPI  #5 hypertension: Slightly hypotensive at this time hold blood pressure medications until back to normal  #6 neck pain: Obtain dedicated x-ray of the cervical spine  All the records are reviewed and case discussed with Care Management/Social Workerr. Management plans discussed with the patient, family and they are in agreement.  CODE STATUS: full  TOTAL TIME TAKING CARE OF THIS PATIENT: 25 minutes.  Greater than 50% of time spent in care coordination and counseling. POSSIBLE D/C later this afternoon after neurology consultation   Elby Showers  M.D on 10/20/2015 at 12:01 PM  Between 7am to 6pm - Pager - [phone]  After 6pm go to [email or web address] - password [email or web address] [facility]  [facility]  Office  [phone]  CC: Primary care physician; No primary care provider on file.

## 2015-10-20 NOTE — Discharge Instructions (Signed)
NO driving for 6 months You have a new prescription for a seizure medication called Vimpat. You have a new prescription for a headache medication called Fioricet Please remember that hydrochlorothiazide can cause low potassium. If you do take the medication for swelling supplement with fruits or orange juice. You should stop taking phentermine as it can contribute to headache Please call your primary care provider for an appointment within the next 2 weeks Please follow up with neurology within the next 3 weeks  DIET:  Regular diet  DISCHARGE CONDITION:  Fair  ACTIVITY:  Activity as tolerated  OXYGEN:  Home Oxygen: No.   Oxygen Delivery: room air  DISCHARGE LOCATION:  home   If you experience worsening of your admission symptoms, develop shortness of breath, life threatening emergency, suicidal or homicidal thoughts you must seek medical attention immediately by calling 911 or calling your MD immediately  if symptoms less severe.  You Must read complete instructions/literature along with all the possible adverse reactions/side effects for all the Medicines you take and that have been prescribed to you. Take any new Medicines after you have completely understood and accpet all the possible adverse reactions/side effects.   Please note  You were cared for by a hospitalist during your hospital stay. If you have any questions about your discharge medications or the care you received while you were in the hospital after you are discharged, you can call the unit and asked to speak with the hospitalist on call if the hospitalist that took care of you is not available. Once you are discharged, your primary care physician will handle any further medical issues. Please note that NO REFILLS for any discharge medications will be authorized once you are discharged, as it is imperative that you return to your primary care physician (or establish a relationship with a primary care physician if you do  not have one) for your aftercare needs so that they can reassess your need for medications and monitor your lab values.

## 2015-10-20 NOTE — Progress Notes (Signed)
[facility]; verbally reviewed AVS with pt including medications/gave Rxs for Fioricet and Lacosamide to pt; diet; activity level and follow up appointment/pt to call Dr Sherryll BurgerShah, Neurologist's office to schedule appointment for 3 weeks; no questions voiced at this time; pt discharged via wheelchair by nursing to the visitor's entrance

## 2015-10-20 NOTE — Progress Notes (Signed)
MRI done-negative for findings. CS xray done. K+ normalized with supplement completed. HA controlled with norco. Pt eating ordered diet well; takes pills whole in applesauce without difficulty; drinks water without difficulty. Wants to advance diet to more solid foods. Speech clear. Mood has dull affect; expressionless face. Moves all 4 extremities normally. Significant other at bedside. ST and neuro consult negative- stroke workup negative. Education completed. Pt desires to be discharged home with MD agreeable.

## 2015-10-20 NOTE — Consult Note (Signed)
Reason for Consult: facial numbness Referring Physician: Dr. Raliegh Ip is an 49 y.o. female.  HPI: 49 yo RHD F presents to [facility] after having L facial numbness and droop that then moved to her L arm.  Pt had headache and slurred speech with episode that lasted for a two hours.  Pt had a similar episode two years ago and was diagnosed with TIA.  Pt has hx of grand mal seizures but not one for the past 6 months and these all started after multiple episodes of head trauma in 2003.  Pt feels better now except for headache.  Past Medical History  Diagnosis Date  . Seizures ([place])   . TIA (transient ischemic attack)     Past Surgical History  Procedure Laterality Date  . Abdominal hysterectomy    . Appendectomy      History reviewed. No pertinent family history.  Social History:  reports that she has been smoking Cigarettes.  She has been smoking about 1.00 pack per day. She does not have any smokeless tobacco history on file. She reports that she drinks alcohol. Her drug history is not on file.  Allergies: No Known Allergies  Medications: personally reviewed by me as per chart  Results for orders placed or performed during the hospital encounter of 10/19/15 (from the past 48 hour(s))  Ethanol     Status: Abnormal   Collection Time: 10/19/15  6:55 PM  Result Value Ref Range   Alcohol, Ethyl (B) 8 (H) <5 mg/dL    Comment:        LOWEST DETECTABLE LIMIT FOR SERUM ALCOHOL IS 5 mg/dL FOR MEDICAL PURPOSES ONLY   Protime-INR     Status: Abnormal   Collection Time: 10/19/15  6:55 PM  Result Value Ref Range   Prothrombin Time 11.3 (L) 11.4 - 15.0 seconds   INR 0.80   APTT     Status: None   Collection Time: 10/19/15  6:55 PM  Result Value Ref Range   aPTT 27 24 - 36 seconds  Comprehensive metabolic panel     Status: Abnormal   Collection Time: 10/19/15  6:55 PM  Result Value Ref Range   Sodium 138 135 - 145 mmol/L   Potassium 3.0 (L) 3.5 - 5.1 mmol/L   Chloride 104 101  - 111 mmol/L   CO2 26 22 - 32 mmol/L   Glucose, Bld 103 (H) 65 - 99 mg/dL   BUN 18 6 - 20 mg/dL   Creatinine, Ser 0.79 0.44 - 1.00 mg/dL   Calcium 9.1 8.9 - 10.3 mg/dL   Total Protein 7.5 6.5 - 8.1 g/dL   Albumin 4.1 3.5 - 5.0 g/dL   AST 20 15 - 41 U/L   ALT 18 14 - 54 U/L   Alkaline Phosphatase 73 38 - 126 U/L   Total Bilirubin 0.4 0.3 - 1.2 mg/dL   GFR calc non Af Amer >60 >60 mL/min   GFR calc Af Amer >60 >60 mL/min    Comment: (NOTE) The eGFR has been calculated using the CKD EPI equation. This calculation has not been validated in all clinical situations. eGFR's persistently <60 mL/min signify possible Chronic Kidney Disease.    Anion gap 8 5 - 15  CBC     Status: Abnormal   Collection Time: 10/19/15  6:55 PM  Result Value Ref Range   WBC 6.6 3.6 - 11.0 K/uL   RBC 4.12 3.80 - 5.20 MIL/uL   Hemoglobin 13.9 12.0 - 16.0  g/dL   HCT 40.2 35.0 - 47.0 %   MCV 97.5 80.0 - 100.0 fL   MCH 33.7 26.0 - 34.0 pg   MCHC 34.6 32.0 - 36.0 g/dL   RDW 14.7 (H) 11.5 - 14.5 %   Platelets 273 150 - 440 K/uL  Troponin I     Status: None   Collection Time: 10/19/15  6:55 PM  Result Value Ref Range   Troponin I <0.03 <0.031 ng/mL    Comment:        NO INDICATION OF MYOCARDIAL INJURY.   Urine Drug Screen, Qualitative ([facility] only)     Status: None   Collection Time: 10/19/15  8:11 PM  Result Value Ref Range   Tricyclic, Ur Screen NONE DETECTED NONE DETECTED   Amphetamines, Ur Screen NONE DETECTED NONE DETECTED   MDMA (Ecstasy)Ur Screen NONE DETECTED NONE DETECTED   Cocaine Metabolite,Ur [place] NONE DETECTED NONE DETECTED   Opiate, Ur Screen NONE DETECTED NONE DETECTED   Phencyclidine (PCP) Ur S NONE DETECTED NONE DETECTED   Cannabinoid 50 Ng, Ur  NONE DETECTED NONE DETECTED   Barbiturates, Ur Screen NONE DETECTED NONE DETECTED   Benzodiazepine, Ur Scrn NONE DETECTED NONE DETECTED   Methadone Scn, Ur NONE DETECTED NONE DETECTED    Comment: (NOTE) 701  Tricyclics, urine                Cutoff 1000 ng/mL 200  Amphetamines, urine             Cutoff 1000 ng/mL 300  MDMA (Ecstasy), urine           Cutoff 500 ng/mL 400  Cocaine Metabolite, urine       Cutoff 300 ng/mL 500  Opiate, urine                   Cutoff 300 ng/mL 600  Phencyclidine (PCP), urine      Cutoff 25 ng/mL 700  Cannabinoid, urine              Cutoff 50 ng/mL 800  Barbiturates, urine             Cutoff 200 ng/mL 900  Benzodiazepine, urine           Cutoff 200 ng/mL 1000 Methadone, urine                Cutoff 300 ng/mL 1100 1200 The urine drug screen provides only a preliminary, unconfirmed 1300 analytical test result and should not be used for non-medical 1400 purposes. Clinical consideration and professional judgment should 1500 be applied to any positive drug screen result due to possible 1600 interfering substances. A more specific alternate chemical method 1700 must be used in order to obtain a confirmed analytical result.  1800 Gas chromato graphy / mass spectrometry (GC/MS) is the preferred 1900 confirmatory method.   Urinalysis complete, with microscopic ([facility] only)     Status: Abnormal   Collection Time: 10/19/15  8:11 PM  Result Value Ref Range   Color, Urine YELLOW (A) YELLOW   APPearance HAZY (A) CLEAR   Glucose, UA NEGATIVE NEGATIVE mg/dL   Bilirubin Urine NEGATIVE NEGATIVE   Ketones, ur NEGATIVE NEGATIVE mg/dL   Specific Gravity, Urine 1.019 1.005 - 1.030   Hgb urine dipstick NEGATIVE NEGATIVE   pH 6.0 5.0 - 8.0   Protein, ur NEGATIVE NEGATIVE mg/dL   Nitrite NEGATIVE NEGATIVE   Leukocytes, UA NEGATIVE NEGATIVE   RBC / HPF 0-5 0 - 5 RBC/hpf   WBC,  UA 0-5 0 - 5 WBC/hpf   Bacteria, UA NONE SEEN NONE SEEN   Squamous Epithelial / LPF 6-30 (A) NONE SEEN  CBC     Status: Abnormal   Collection Time: 10/20/15  4:03 AM  Result Value Ref Range   WBC 4.4 3.6 - 11.0 K/uL   RBC 3.54 (L) 3.80 - 5.20 MIL/uL   Hemoglobin 11.9 (L) 12.0 - 16.0 g/dL   HCT 35.2 35.0 - 47.0 %   MCV 99.3 80.0 - 100.0  fL   MCH 33.6 26.0 - 34.0 pg   MCHC 33.8 32.0 - 36.0 g/dL   RDW 14.9 (H) 11.5 - 14.5 %   Platelets 208 150 - 440 K/uL  Comprehensive metabolic panel     Status: Abnormal   Collection Time: 10/20/15  4:03 AM  Result Value Ref Range   Sodium 140 135 - 145 mmol/L   Potassium 3.0 (L) 3.5 - 5.1 mmol/L   Chloride 110 101 - 111 mmol/L   CO2 25 22 - 32 mmol/L   Glucose, Bld 101 (H) 65 - 99 mg/dL   BUN 18 6 - 20 mg/dL   Creatinine, Ser 0.63 0.44 - 1.00 mg/dL   Calcium 8.4 (L) 8.9 - 10.3 mg/dL   Total Protein 6.1 (L) 6.5 - 8.1 g/dL   Albumin 3.5 3.5 - 5.0 g/dL   AST 15 15 - 41 U/L   ALT 14 14 - 54 U/L   Alkaline Phosphatase 61 38 - 126 U/L   Total Bilirubin 0.6 0.3 - 1.2 mg/dL   GFR calc non Af Amer >60 >60 mL/min   GFR calc Af Amer >60 >60 mL/min    Comment: (NOTE) The eGFR has been calculated using the CKD EPI equation. This calculation has not been validated in all clinical situations. eGFR's persistently <60 mL/min signify possible Chronic Kidney Disease.    Anion gap 5 5 - 15  Sedimentation rate     Status: None   Collection Time: 10/20/15  4:03 AM  Result Value Ref Range   Sed Rate 17 0 - 20 mm/hr  Magnesium     Status: None   Collection Time: 10/20/15  4:03 AM  Result Value Ref Range   Magnesium 2.0 1.7 - 2.4 mg/dL    Ct Head Wo Contrast  10/19/2015  CLINICAL DATA:  Acute onset right-sided weakness and facial droop 4 hours ago. Code stroke. EXAM: CT HEAD WITHOUT CONTRAST TECHNIQUE: Contiguous axial images were obtained from the base of the skull through the vertex without intravenous contrast. COMPARISON:  03/19/14 FINDINGS: Brain: No evidence of acute infarction, hemorrhage, extra-axial collection, ventriculomegaly, or mass effect. Vascular: No hyperdense vessel or unexpected calcification. Skull: Negative for fracture or focal lesion. Sinuses/Orbits: No acute findings. Other: None. IMPRESSION: Negative noncontrast head CT. These results were called by telephone at the time  of interpretation on 10/19/2015 at 7:07 pm to Dr. Harvest Dark , who verbally acknowledged these results. Electronically Signed   By: Earle Gell M.D.   On: 10/19/2015 19:08   Mr Jeri Cos EK Contrast  10/20/2015  CLINICAL DATA:  Facial numbness left greater than right EXAM: MRI HEAD WITHOUT AND WITH CONTRAST TECHNIQUE: Multiplanar, multiecho pulse sequences of the brain and surrounding structures were obtained without and with intravenous contrast. CONTRAST:  46m MULTIHANCE GADOBENATE DIMEGLUMINE 529 MG/ML IV SOLN COMPARISON:  CT head 10/19/2015 FINDINGS: Ventricle size normal. Cerebral volume normal. Pituitary normal in size. Cervical medullary junction normal. Negative for acute or chronic ischemia. Negative for  demyelinating disease. Normal white matter. Normal basal ganglia and brainstem. Negative for hemorrhage or fluid collection. Negative for mass or edema.  No shift of the midline structures. Postcontrast imaging reveals normal enhancement. Negative for enhancing mass. Leptomeningeal enhancement is normal. Vascular enhancement is normal. Paranasal sinuses clear.  Orbit is normal. IMPRESSION: Normal contrast-enhanced MRI of the head. Electronically Signed   By: Franchot Gallo M.D.   On: 10/20/2015 10:12    Review of Systems  Constitutional: Negative.   HENT: Negative for congestion, ear discharge, ear pain, hearing loss, nosebleeds, sore throat and tinnitus.   Eyes: Negative.   Respiratory: Negative.  Negative for stridor.   Cardiovascular: Negative.   Gastrointestinal: Negative.   Genitourinary: Negative.   Musculoskeletal: Negative.   Skin: Negative.   Neurological: Positive for dizziness, sensory change and headaches. Negative for tingling, tremors, speech change, focal weakness, seizures and loss of consciousness.  Psychiatric/Behavioral: Negative.    Blood pressure 108/77, pulse 83, temperature 97.8 F (36.6 C), temperature source Oral, resp. rate 20, height 5' 4"  (1.626 m),  weight 77.111 kg (170 lb), SpO2 99 %. Physical Exam  Nursing note and vitals reviewed. Constitutional: She is oriented to person, place, and time. She appears well-developed and well-nourished. No distress.  HENT:  Head: Normocephalic and atraumatic.  Right Ear: External ear normal.  Left Ear: External ear normal.  Nose: Nose normal.  Mouth/Throat: Oropharynx is clear and moist.  Eyes: Conjunctivae and EOM are normal. Pupils are equal, round, and reactive to light. No scleral icterus.  Neck: Normal range of motion. Neck supple.  Cardiovascular: Normal rate, regular rhythm, normal heart sounds and intact distal pulses.   No murmur heard. Respiratory: Effort normal and breath sounds normal. No respiratory distress.  GI: Soft. Bowel sounds are normal. She exhibits distension.  Musculoskeletal: Normal range of motion. She exhibits no edema.  Neurological: She is alert and oriented to person, place, and time. She has normal reflexes. She displays normal reflexes. No cranial nerve deficit. She exhibits normal muscle tone. Coordination normal.  Skin: Skin is warm and dry. She is not diaphoretic.  Psychiatric: She has a normal mood and affect.   MRI of brain personally reviewed by me and is normal  Assessment/Plan: 1.  Simple partial seizure-  This is cause of pts symptoms and can lead to grand mal seizure 2.  Headache-  Likely post-ictal -  Continue topamax 112m BID -  D/c phentamine -  Start Vimpat 512mBID PO -  PRN fioricet or naproxen for headache -  Pt advised not to drive or operate heavy machinery x 6 months -  Will sign off, please call with questions -  [facility] or [facility] if problems  Deaveon Schoen 10/20/2015, 12:58 PM

## 2015-10-20 NOTE — Evaluation (Signed)
Clinical/Bedside Swallow Evaluation Patient Details  Name: Charlotte Hernandez MRN: [id number] Date of Birth: 01/12/1966  Today's Date: 10/20/2015 Time: SLP Start Time (ACUTE ONLY): 1000 SLP Stop Time (ACUTE ONLY): 1100 SLP Time Calculation (min) (ACUTE ONLY): 60 min  Past Medical History:  Past Medical History  Diagnosis Date  . Seizures (HCC)   . TIA (transient ischemic attack)    Past Surgical History:  Past Surgical History  Procedure Laterality Date  . Abdominal hysterectomy    . Appendectomy     HPI:      Assessment / Plan / Recommendation Clinical Impression  Pt appeared to adequately tolerate trials of thin liquids and purees w/ no overt s/s of aspiration noted; appropriate oral clearing noted though pt appeared to "concentrate" on the boluses during the oral phase and take her time w/ them. Pt stated it was "fine". Noted overall slower movements, softer/lower volume of speech, and a subdued presentation. Pt indicated no direct pain but stated it felt "different" on the Left side of her neck and throat area. Pt fed self appropriately stating no weakness in the Left UE currently. Pt communicated at conversational level w/ appropriate speech - No Aphasia. Noted reduced volume of speech; inconsistent dysarthria of speech but overall 100% intelligible. Pt indicated different "feelings" in her neck and throat; updated MD. Rec. a pureed diet w/ thin liquids today w/ trials to upgrade tomorrow. Rec. meds in Puree. NSG and MD updated; pt/family agreed.      Aspiration Risk   (reduced risk)    Diet Recommendation  pureed diet; thin liquids; meds in puree  Medication Administration: Whole meds with puree    Other  Recommendations Recommended Consults:  (Neurologist following) Oral Care Recommendations: Oral care BID;Patient independent with oral care   Follow up Recommendations   (TBD)    Frequency and Duration min 2x/week  1 week       Swallow Study   General Date of Onset:  10/19/15 Type of Study: Bedside Swallow Evaluation Previous Swallow Assessment: none Diet Prior to this Study: Regular;Thin liquids Temperature Spikes Noted: No Respiratory Status: Room air History of Recent Intubation: No Behavior/Cognition: Alert;Cooperative;Pleasant mood (subdued; quiet) Oral Cavity Assessment: Within Functional Limits Oral Care Completed by SLP: No Oral Cavity - Dentition: Adequate natural dentition Vision: Functional for self-feeding Self-Feeding Abilities: Able to feed self Patient Positioning: Upright in bed Baseline Vocal Quality: Low vocal intensity Volitional Cough: Strong Volitional Swallow: Able to elicit    Oral/Motor/Sensory Function Overall Oral Motor/Sensory Function: Mild impairment Facial ROM: Within Functional Limits Facial Symmetry: Within Functional Limits Facial Strength: Within Functional Limits Facial Sensation: Reduced left Lingual ROM: Within Functional Limits (grossly) Lingual Symmetry: Within Functional Limits Lingual Strength: Reduced (inconsistent during protrusion) Lingual Sensation: Within Functional Limits (grossly) Velum: Within Functional Limits Mandible: Within Functional Limits   Ice Chips Ice chips: Within functional limits Presentation: Spoon (fed; 3 trials)   Thin Liquid Thin Liquid: Within functional limits Presentation: Cup;Self Fed (8 trials)    Nectar Thick Nectar Thick Liquid: Not tested   Honey Thick Honey Thick Liquid: Not tested   Puree Puree: Within functional limits Presentation: Self Fed;Spoon (5 trials) Other Comments: pt appeared to concentrate on the boluses she was taking and take her time swallowing them although she stated it was "fine".    Solid Solid: Not tested Other Comments: will wait until tomorrow for upgrade food consistency sec. puree trials presentation      Jerilynn SomKatherine Nathen Balaban, MS, CCC-SLP  Reona Zendejas 10/20/2015,11:08 AM

## 2015-10-20 NOTE — Progress Notes (Signed)
-  Telephone call 10/20/15 from the pt, Charlotte Hernandez; advising that she went to get the Rxs filled and the Rx for lacosamide at [facility] was not in stock and even with her insurance, there would be a $100 copay, she did not have the money; advised the pt that I would get in contact with her discharging physician, Dr. Clayton Lefort. Walsh to see if something could be done for her and call her back; Zella BallRobin gave a contact number of [phone]. -Telephone call 10/20/15 at 1719 spoke with Dr C. Clent RidgesWalsh via telephone call; advised her that when the pt went to the [facility] to get the lacosamide Rx filled the pharmacy did not have it in stock and even with her insurance there would be a $100 copay for her to pay; the pt did not have the money for this Rx; Dr Clent RidgesWalsh advised that she would contact Dr. Katrinka BlazingSmith, the Neurologist to see about another Rx drug for seizures that he would recommend for the pt and then if one is recommended, she would escribe the Rx to [facility] for the pt by 10/21/15 am -Telephone call 10/20/15 at 1723 to Charlotte Hernandez [phone] advised her that Dr Clent RidgesWalsh was going to call Dr Katrinka BlazingSmith to ask him about another seizure medication that could be prescribed for the pt that would be effective for her seizures and be cost effective for her; need to check with [facility] in the am to see if they received a new prescription for her

## 2015-10-20 NOTE — Progress Notes (Signed)
Telephone call 10/20/15 1754 from Dr. Clayton Lefort. Walsh advising that she would e-scribe Tegretol to the [facility] for the pt; verbally advised Dr Clent RidgesWalsh that the pt will check the pharmacy in the am

## 2015-10-20 NOTE — Discharge Summary (Addendum)
[facility] -  at [facility]  DISCHARGE SUMMARY   PATIENT NAME: Charlotte Hernandez    MR#:  [id number]  DATE OF BIRTH:  02-07-1966  DATE OF ADMISSION:  10/19/2015 ADMITTING PHYSICIAN: Marguarite Arbour, MD  DATE OF DISCHARGE: 10/20/2015  PRIMARY CARE PHYSICIAN: No primary care provider on file.    ADMISSION DIAGNOSIS:  Facial numbness [R20.8] Cerebral infarction due to unspecified mechanism [I63.9]  DISCHARGE DIAGNOSIS:  Active Problems:   Facial numbness   Seizures (HCC)   SECONDARY DIAGNOSIS:   Past Medical History  Diagnosis Date  . Seizures (HCC)   . TIA (transient ischemic attack)     HOSPITAL COURSE:    #1 facial spasm, left sided neck pain: CT and MRI are negative.Seen by neurology who feels these symptoms could be due to seizure. Initial recommendation to add Vempat, but this is too expensive for the patient. Will add tegretol per Dr. Michaelle Copas recommendation. She will follow up with neurology in 3 weeks.  #2 hypokalemia: Replaced. Magnesium is normal. This may be contributing to some of her muscle spasm and discomfort. Stop hydrochlorothiazide as this is likely contributing  #3 history of seizures: see above  #4 GERD:continue PPI  #5 hypertension: stop hctz. Blood pressures have been normal.  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 Pain in left neck likely due to muscle spasm during seizure.  Recommend gentle stretching.  7. Headache: prescription for fiorecet.   DISCHARGE CONDITIONS:   Stable  CONSULTS OBTAINED:    Neurology Dr. Katrinka Blazing  DRUG ALLERGIES:  No Known Allergies  DISCHARGE MEDICATIONS:   Discharge Medication List as of 10/20/2015  4:10 PM    START taking these medications   Details  butalbital-acetaminophen-caffeine (FIORICET) 50-325-40 MG tablet Take 1-2 tablets by mouth every 6 (six) hours as needed for headache., Starting 10/20/2015, Until Mon 10/19/16, Normal    lacosamide (VIMPAT) 50 MG  TABS tablet Take 1 tablet (50 mg total) by mouth 2 (two) times daily., Starting 10/20/2015, Until Discontinued, Print      CONTINUE these medications which have NOT CHANGED   Details  aspirin EC 81 MG tablet Take 81 mg by mouth daily., Until Discontinued, Historical Med    Cyanocobalamin (VITAMIN B-12 CR) 1000 MCG TBCR Take 1,000 mcg by mouth daily., Until Discontinued, Historical Med    omeprazole (PRILOSEC) 40 MG capsule Take 40 mg by mouth daily., Starting 10/14/2015, Until Discontinued, Historical Med    promethazine (PHENERGAN) 25 MG tablet Take 25 mg by mouth every 6 (six) hours as needed. For nausea., Starting 04/25/2014, Until Discontinued, Historical Med    topiramate (TOPAMAX) 100 MG tablet Take 100 mg by mouth 2 (two) times daily., Starting 09/11/2015, Until Discontinued, Historical Med      STOP taking these medications     hydrochlorothiazide (HYDRODIURIL) 25 MG tablet      phentermine (ADIPEX-P) 37.5 MG tablet          DISCHARGE INSTRUCTIONS:   Stable condition. Regular diet. No home health needs  If you experience worsening of your admission symptoms, develop shortness of breath, life threatening emergency, suicidal or homicidal thoughts you must seek medical attention immediately by calling 911 or calling your MD immediately  if symptoms less severe.  You Must read complete instructions/literature along with all the possible adverse reactions/side effects for all the Medicines you take and that have been prescribed to you. Take any new Medicines after you have completely understood and accept all the possible adverse  reactions/side effects.   Please note  You were cared for by a hospitalist during your hospital stay. If you have any questions about your discharge medications or the care you received while you were in the hospital after you are discharged, you can call the unit and asked to speak with the hospitalist on call if the hospitalist that took care of you  is not available. Once you are discharged, your primary care physician will handle any further medical issues. Please note that NO REFILLS for any discharge medications will be authorized once you are discharged, as it is imperative that you return to your primary care physician (or establish a relationship with a primary care physician if you do not have one) for your aftercare needs so that they can reassess your need for medications and monitor your lab values.    Today   CHIEF COMPLAINT:   Chief Complaint  Patient presents with  . Weakness    R facial weakness, states began with mouth numbness 3 hours ago    HISTORY OF PRESENT ILLNESS:  Charlotte Hernandez is a 49 y.o. female has a past medical history significant for siezures now with bilateral facial numbness, left>right. Had similar episode 2 years ago and was told she had a "TIA". Also some mild LUE weakness. No fever. Denies HA or visual changes. No seizures. Head CT non-acute. She is now admitted for further evaluation.  VITAL SIGNS:  Blood pressure 108/77, pulse 83, temperature 97.8 F (36.6 C), temperature source Oral, resp. rate 20, height 5\' 4"  (1.626 m), weight 77.111 kg (170 lb), SpO2 99 %.  I/O:    Intake/Output Summary (Last 24 hours) at 10/20/15 1752 Last data filed at 10/20/15 1306  Gross per 24 hour  Intake 3440.75 ml  Output    300 ml  Net 3140.75 ml    PHYSICAL EXAMINATION:  GENERAL: 49 y.o.-year-old patient lying in the bed, uncomfortable  EYES: Pupils equal, round, reactive to light and accommodation. No scleral icterus. Extraocular muscles intact.  HEENT: Head atraumatic, normocephalic. Oropharynx and nasopharynx clear. Mucous membranes are moist NECK: Supple, no jugular venous distention. No thyroid enlargement, no tenderness. Some tenderness on the posterior and left side of the neck, full range of motion LUNGS: Normal breath sounds bilaterally, no wheezing, rales,rhonchi or crepitation. No use of  accessory muscles of respiration.  CARDIOVASCULAR: S1, S2 normal. No murmurs, rubs, or gallops.  ABDOMEN: Soft, nontender, nondistended. Bowel sounds present. No organomegaly or mass.  EXTREMITIES: No pedal edema, cyanosis, or clubbing.  NEUROLOGIC: Cranial nerves II through XII are intact. Muscle strength 5/5 in all extremities. Sensation intact. Gait not checked.  PSYCHIATRIC: The patient is alert and oriented x 3. Flat affect. Seems uncomfortable and depressed SKIN: No obvious rash, lesion, or ulcer.   DATA REVIEW:   CBC  Recent Labs Lab 10/20/15 0403  WBC 4.4  HGB 11.9*  HCT 35.2  PLT 208    Chemistries   Recent Labs Lab 10/20/15 0403 10/20/15 1446  NA 140 139  K 3.0* 4.2  CL 110 111  CO2 25 22  GLUCOSE 101* 103*  BUN 18 11  CREATININE 0.63 0.59  CALCIUM 8.4* 8.8*  MG 2.0  --   AST 15  --   ALT 14  --   ALKPHOS 61  --   BILITOT 0.6  --     Cardiac Enzymes  Recent Labs Lab 10/19/15 1855  TROPONINI <0.03    Microbiology Results  No results found for  this or any previous visit.  RADIOLOGY:  Dg Cervical Spine 2 Or 3 Views  10/20/2015  CLINICAL DATA:  Neck pain EXAM: CERVICAL SPINE - 2-3 VIEW COMPARISON:  None. FINDINGS: Negative for fracture. Mild disc degeneration and spondylosis at C5-6 and C6-7. Prevertebral soft tissues normal. IMPRESSION: Cervical spondylosis C5-6 and C6-7.  No acute abnormality. Electronically Signed   By: Marlan Palau M.D.   On: 10/20/2015 14:52   Ct Head Wo Contrast  10/19/2015  CLINICAL DATA:  Acute onset right-sided weakness and facial droop 4 hours ago. Code stroke. EXAM: CT HEAD WITHOUT CONTRAST TECHNIQUE: Contiguous axial images were obtained from the base of the skull through the vertex without intravenous contrast. COMPARISON:  03/19/14 FINDINGS: Brain: No evidence of acute infarction, hemorrhage, extra-axial collection, ventriculomegaly, or mass effect. Vascular: No hyperdense vessel or unexpected calcification.  Skull: Negative for fracture or focal lesion. Sinuses/Orbits: No acute findings. Other: None. IMPRESSION: Negative noncontrast head CT. These results were called by telephone at the time of interpretation on 10/19/2015 at 7:07 pm to Dr. Minna Antis , who verbally acknowledged these results. Electronically Signed   By: Myles Rosenthal M.D.   On: 10/19/2015 19:08   Mr Laqueta Jean ZO Contrast  10/20/2015  CLINICAL DATA:  Facial numbness left greater than right EXAM: MRI HEAD WITHOUT AND WITH CONTRAST TECHNIQUE: Multiplanar, multiecho pulse sequences of the brain and surrounding structures were obtained without and with intravenous contrast. CONTRAST:  15mL MULTIHANCE GADOBENATE DIMEGLUMINE 529 MG/ML IV SOLN COMPARISON:  CT head 10/19/2015 FINDINGS: Ventricle size normal. Cerebral volume normal. Pituitary normal in size. Cervical medullary junction normal. Negative for acute or chronic ischemia. Negative for demyelinating disease. Normal white matter. Normal basal ganglia and brainstem. Negative for hemorrhage or fluid collection. Negative for mass or edema.  No shift of the midline structures. Postcontrast imaging reveals normal enhancement. Negative for enhancing mass. Leptomeningeal enhancement is normal. Vascular enhancement is normal. Paranasal sinuses clear.  Orbit is normal. IMPRESSION: Normal contrast-enhanced MRI of the head. Electronically Signed   By: Marlan Palau M.D.   On: 10/20/2015 10:12    EKG:   Orders placed or performed during the hospital encounter of 10/19/15  . EKG 12-Lead  . EKG 12-Lead  . ED EKG  . ED EKG      Management plans discussed with the patient, family and they are in agreement.  CODE STATUS:     Code Status Orders        Start     Ordered   10/19/15 2228  Full code   Continuous     10/19/15 2228      TOTAL TIME TAKING CARE OF THIS PATIENT:35 minutes.  Greater than 50% of time spent in care coordination and counseling.  Elby Showers M.D on  10/20/2015 at 5:52 PM  Between 7am to 6pm - Pager - [phone]  After 6pm go to [email or web address] - password [email or web address] [facility]  [facility]  Office  [phone]  CC: Primary care physician; No primary care provider on file.

## 2015-10-29 ENCOUNTER — Other Ambulatory Visit: Payer: Self-pay | Admitting: Orthopedic Surgery

## 2015-10-29 DIAGNOSIS — M25561 Pain in right knee: Secondary | ICD-10-CM

## 2015-11-19 ENCOUNTER — Ambulatory Visit
Admission: RE | Admit: 2015-11-19 | Discharge: 2015-11-19 | Disposition: A | Discharge status: Home / Self Care | Payer: BLUE CROSS/BLUE SHIELD | Source: Ambulatory Visit | Attending: Orthopedic Surgery | Admitting: Orthopedic Surgery

## 2015-11-19 DIAGNOSIS — S83231A Complex tear of medial meniscus, current injury, right knee, initial encounter: Secondary | ICD-10-CM | POA: Insufficient documentation

## 2015-11-19 DIAGNOSIS — M25561 Pain in right knee: Secondary | ICD-10-CM | POA: Diagnosis present

## 2015-11-19 DIAGNOSIS — T85618A Breakdown (mechanical) of other specified internal prosthetic devices, implants and grafts, initial encounter: Secondary | ICD-10-CM | POA: Diagnosis not present

## 2015-12-03 ENCOUNTER — Other Ambulatory Visit: Payer: BLUE CROSS/BLUE SHIELD

## 2015-12-03 ENCOUNTER — Encounter: Payer: Self-pay | Admitting: *Deleted

## 2015-12-03 NOTE — Patient Instructions (Signed)
  Your procedure is scheduled on: 12-11-15 [facility](WEDNESDAY) Report to [facility] SAME DAY SURGERY 2ND FLOOR To find out your arrival time please call [phone] between 1PM - 3PM on 12-10-15 (TUESDAY)  Remember: Instructions that are not followed completely may result in serious medical risk, up to and including death, or upon the discretion of your surgeon and anesthesiologist your surgery may need to be rescheduled.    _X___ 1. Do not eat food or drink liquids after midnight. No gum chewing or hard candies.     _X___ 2. No Alcohol for 24 hours before or after surgery.   ____ 3. Bring all medications with you on the day of surgery if instructed.    _X___ 4. Notify your doctor if there is any change in your medical condition     (cold, fever, infections).     Do not wear jewelry, make-up, hairpins, clips or nail polish.  Do not wear lotions, powders, or perfumes. You may wear deodorant.  Do not shave 48 hours prior to surgery. Men may shave face and neck.  Do not bring valuables to the hospital.    [facility] is not responsible for any belongings or valuables.               Contacts, dentures or bridgework may not be worn into surgery.  Leave your suitcase in the car. After surgery it may be brought to your room.  For patients admitted to the hospital, discharge time is determined by your treatment team.   Patients discharged the day of surgery will not be allowed to drive home.   Please read over the following fact sheets that you were given:     _X___ Take these medicines the morning of surgery with A SIP OF WATER:    1. TOPAMAX (TOPIRAMATE)  2.  PRILOSEC  3.  TAKE AN EXTRA PRILOSEC ON Tuesday NIGHT  4.  5.  6.  ____ Fleet Enema (as directed)   _X___ Use CHG Soap as directed  ____ Use inhalers on the day of surgery  ____ Stop metformin 2 days prior to surgery    ____ Take 1/2 of usual insulin dose the night before surgery and none on the morning of surgery.   _X___  Stop Coumadin/Plavix/aspirin-STOP ASPIRIN 7 DAYS PRIOR TO SURGERY (PER DR KRASINSKI PER PT)  _X___ Stop Anti-inflammatories-STOP IBUPROFEN NOW-NO NSAIDS OR ASPIRIN PRODUCTS-TYLENOL OK   _X___ Stop supplements until after surgery-STOP TURMERIC NOW   ____ Bring C-Pap to the hospital.

## 2015-12-04 ENCOUNTER — Inpatient Hospital Stay: Admission: RE | Admit: 2015-12-04 | Payer: BLUE CROSS/BLUE SHIELD | Source: Ambulatory Visit

## 2015-12-06 ENCOUNTER — Encounter
Admission: RE | Admit: 2015-12-06 | Discharge: 2015-12-06 | Disposition: A | Discharge status: Home / Self Care | Payer: BLUE CROSS/BLUE SHIELD | Source: Ambulatory Visit | Attending: Orthopedic Surgery | Admitting: Orthopedic Surgery

## 2015-12-06 DIAGNOSIS — Z01818 Encounter for other preprocedural examination: Secondary | ICD-10-CM | POA: Diagnosis not present

## 2015-12-06 DIAGNOSIS — Z01812 Encounter for preprocedural laboratory examination: Secondary | ICD-10-CM | POA: Diagnosis present

## 2015-12-06 LAB — URINALYSIS COMPLETE WITH MICROSCOPIC (ARMC ONLY)
BACTERIA UA: NONE SEEN
BILIRUBIN URINE: NEGATIVE
GLUCOSE, UA: NEGATIVE mg/dL
Hgb urine dipstick: NEGATIVE
Leukocytes, UA: NEGATIVE
Nitrite: NEGATIVE
Protein, ur: NEGATIVE mg/dL
RBC / HPF: NONE SEEN RBC/hpf (ref 0–5)
Specific Gravity, Urine: 1.024 (ref 1.005–1.030)
WBC, UA: NONE SEEN WBC/hpf (ref 0–5)
pH: 6 (ref 5.0–8.0)

## 2015-12-06 LAB — PROTIME-INR
INR: 1.02
PROTHROMBIN TIME: 13.6 s (ref 11.4–15.0)

## 2015-12-06 LAB — APTT: APTT: 28 s (ref 24–36)

## 2015-12-06 LAB — POTASSIUM: POTASSIUM: 3.7 mmol/L (ref 3.5–5.1)

## 2015-12-10 ENCOUNTER — Encounter: Payer: Self-pay | Admitting: *Deleted

## 2015-12-11 ENCOUNTER — Encounter
Admission: RE | Disposition: A | Discharge status: Home / Self Care | Payer: Self-pay | Source: Ambulatory Visit | Attending: Orthopedic Surgery

## 2015-12-11 ENCOUNTER — Ambulatory Visit: Payer: BLUE CROSS/BLUE SHIELD | Admitting: Certified Registered Nurse Anesthetist

## 2015-12-11 ENCOUNTER — Observation Stay
Admission: RE | Admit: 2015-12-11 | Discharge: 2015-12-13 | Disposition: A | Discharge status: Home / Self Care | Payer: BLUE CROSS/BLUE SHIELD | Source: Ambulatory Visit | Attending: Orthopedic Surgery | Admitting: Orthopedic Surgery

## 2015-12-11 ENCOUNTER — Encounter: Payer: Self-pay | Admitting: *Deleted

## 2015-12-11 DIAGNOSIS — M1711 Unilateral primary osteoarthritis, right knee: Secondary | ICD-10-CM | POA: Diagnosis not present

## 2015-12-11 DIAGNOSIS — S83511A Sprain of anterior cruciate ligament of right knee, initial encounter: Secondary | ICD-10-CM | POA: Insufficient documentation

## 2015-12-11 DIAGNOSIS — Z90710 Acquired absence of both cervix and uterus: Secondary | ICD-10-CM | POA: Diagnosis not present

## 2015-12-11 DIAGNOSIS — Z87891 Personal history of nicotine dependence: Secondary | ICD-10-CM | POA: Insufficient documentation

## 2015-12-11 DIAGNOSIS — Z823 Family history of stroke: Secondary | ICD-10-CM | POA: Diagnosis not present

## 2015-12-11 DIAGNOSIS — Z9889 Other specified postprocedural states: Secondary | ICD-10-CM | POA: Diagnosis not present

## 2015-12-11 DIAGNOSIS — N189 Chronic kidney disease, unspecified: Secondary | ICD-10-CM | POA: Diagnosis not present

## 2015-12-11 DIAGNOSIS — Z23 Encounter for immunization: Secondary | ICD-10-CM | POA: Insufficient documentation

## 2015-12-11 DIAGNOSIS — M797 Fibromyalgia: Secondary | ICD-10-CM | POA: Insufficient documentation

## 2015-12-11 DIAGNOSIS — S83231A Complex tear of medial meniscus, current injury, right knee, initial encounter: Secondary | ICD-10-CM | POA: Diagnosis not present

## 2015-12-11 DIAGNOSIS — Z79899 Other long term (current) drug therapy: Secondary | ICD-10-CM | POA: Diagnosis not present

## 2015-12-11 DIAGNOSIS — Z7982 Long term (current) use of aspirin: Secondary | ICD-10-CM | POA: Diagnosis not present

## 2015-12-11 DIAGNOSIS — X58XXXA Exposure to other specified factors, initial encounter: Secondary | ICD-10-CM | POA: Insufficient documentation

## 2015-12-11 DIAGNOSIS — Z8673 Personal history of transient ischemic attack (TIA), and cerebral infarction without residual deficits: Secondary | ICD-10-CM | POA: Insufficient documentation

## 2015-12-11 DIAGNOSIS — G8918 Other acute postprocedural pain: Secondary | ICD-10-CM | POA: Diagnosis not present

## 2015-12-11 DIAGNOSIS — Z87442 Personal history of urinary calculi: Secondary | ICD-10-CM | POA: Diagnosis not present

## 2015-12-11 DIAGNOSIS — R569 Unspecified convulsions: Secondary | ICD-10-CM | POA: Insufficient documentation

## 2015-12-11 HISTORY — PX: KNEE ARTHROSCOPY WITH ANTERIOR CRUCIATE LIGAMENT (ACL) REPAIR WITH HAMSTRING GRAFT: SHX5645

## 2015-12-11 HISTORY — DX: Fibromyalgia: M79.7

## 2015-12-11 HISTORY — DX: Nausea with vomiting, unspecified: R11.2

## 2015-12-11 HISTORY — DX: Other specified postprocedural states: Z98.890

## 2015-12-11 SURGERY — KNEE ARTHROSCOPY WITH ANTERIOR CRUCIATE LIGAMENT (ACL) REPAIR WITH HAMSTRING GRAFT
Anesthesia: General | Site: Knee | Laterality: Right | Wound class: Clean

## 2015-12-11 MED ORDER — HYDROMORPHONE HCL 1 MG/ML IJ SOLN
1.0000 mg | INTRAMUSCULAR | Status: DC | PRN
  Administered 2015-12-11 (×2): 1 mg via INTRAVENOUS
  Filled 2015-12-11 (×3): qty 1

## 2015-12-11 MED ORDER — BISACODYL 5 MG PO TBEC: 5.0000 mg | DELAYED_RELEASE_TABLET | Freq: Every day | ORAL | Status: DC | PRN

## 2015-12-11 MED ORDER — ONDANSETRON HCL 4 MG/2ML IJ SOLN
INTRAMUSCULAR | Status: DC | PRN
  Administered 2015-12-11: 4 mg via INTRAVENOUS

## 2015-12-11 MED ORDER — CEFAZOLIN SODIUM-DEXTROSE 2-3 GM-% IV SOLR
2.0000 g | Freq: Once | INTRAVENOUS | Status: AC
  Administered 2015-12-11: 2 g via INTRAVENOUS

## 2015-12-11 MED ORDER — METOCLOPRAMIDE HCL 5 MG/ML IJ SOLN
5.0000 mg | Freq: Three times a day (TID) | INTRAMUSCULAR | Status: DC | PRN
  Administered 2015-12-13: 10 mg via INTRAVENOUS
  Filled 2015-12-11: qty 2

## 2015-12-11 MED ORDER — ACETAMINOPHEN 325 MG PO TABS: 650.0000 mg | ORAL_TABLET | Freq: Four times a day (QID) | ORAL | Status: DC | PRN

## 2015-12-11 MED ORDER — PANTOPRAZOLE SODIUM 40 MG PO TBEC
40.0000 mg | DELAYED_RELEASE_TABLET | Freq: Every day | ORAL | Status: DC
  Administered 2015-12-12 – 2015-12-13 (×2): 40 mg via ORAL
  Filled 2015-12-11 (×2): qty 1

## 2015-12-11 MED ORDER — MORPHINE SULFATE (PF) 2 MG/ML IV SOLN
2.0000 mg | INTRAVENOUS | Status: DC | PRN
  Administered 2015-12-11: 2 mg via INTRAVENOUS
  Filled 2015-12-11: qty 1

## 2015-12-11 MED ORDER — VARENICLINE TARTRATE 1 MG PO TABS
1.0000 mg | ORAL_TABLET | Freq: Two times a day (BID) | ORAL | Status: DC
  Administered 2015-12-11 – 2015-12-13 (×4): 1 mg via ORAL
  Filled 2015-12-11 (×6): qty 1

## 2015-12-11 MED ORDER — ONDANSETRON HCL 4 MG/2ML IJ SOLN
4.0000 mg | Freq: Once | INTRAMUSCULAR | Status: AC | PRN
  Administered 2015-12-11: 4 mg via INTRAVENOUS

## 2015-12-11 MED ORDER — FENTANYL CITRATE (PF) 100 MCG/2ML IJ SOLN
25.0000 ug | INTRAMUSCULAR | Status: AC | PRN
  Administered 2015-12-11 (×6): 25 ug via INTRAVENOUS

## 2015-12-11 MED ORDER — SCOPOLAMINE 1 MG/3DAYS TD PT72
1.0000 | MEDICATED_PATCH | TRANSDERMAL | Status: DC
  Administered 2015-12-11: 1.5 mg via TRANSDERMAL

## 2015-12-11 MED ORDER — PROMETHAZINE HCL 25 MG/ML IJ SOLN
INTRAMUSCULAR | Status: AC
  Administered 2015-12-11: 6.25 mg via INTRAVENOUS
  Filled 2015-12-11: qty 1

## 2015-12-11 MED ORDER — ONDANSETRON HCL 4 MG/2ML IJ SOLN
INTRAMUSCULAR | Status: AC
  Administered 2015-12-11: 4 mg via INTRAVENOUS
  Filled 2015-12-11: qty 2

## 2015-12-11 MED ORDER — ASPIRIN 325 MG PO TABS: 325.0000 mg | ORAL_TABLET | Freq: Every day | ORAL | Status: DC

## 2015-12-11 MED ORDER — LACTATED RINGERS IV SOLN
INTRAVENOUS | Status: DC | PRN
  Administered 2015-12-11: 30000 mL

## 2015-12-11 MED ORDER — BUPIVACAINE HCL (PF) 0.25 % IJ SOLN
INTRAMUSCULAR | Status: AC
  Filled 2015-12-11: qty 30

## 2015-12-11 MED ORDER — INFLUENZA VAC SPLIT QUAD 0.5 ML IM SUSY
0.5000 mL | PREFILLED_SYRINGE | INTRAMUSCULAR | Status: AC
  Administered 2015-12-12: 0.5 mL via INTRAMUSCULAR
  Filled 2015-12-11: qty 0.5

## 2015-12-11 MED ORDER — BUPIVACAINE-EPINEPHRINE 0.25% -1:200000 IJ SOLN
INTRAMUSCULAR | Status: DC | PRN
  Administered 2015-12-11: 30 mL

## 2015-12-11 MED ORDER — FENTANYL CITRATE (PF) 100 MCG/2ML IJ SOLN
INTRAMUSCULAR | Status: AC
Start: 2015-12-11 — End: 2015-12-11
  Administered 2015-12-11: 25 ug via INTRAVENOUS
  Filled 2015-12-11: qty 2

## 2015-12-11 MED ORDER — NEOMYCIN-POLYMYXIN B GU 40-200000 IR SOLN
Status: AC
  Filled 2015-12-11: qty 4

## 2015-12-11 MED ORDER — ONDANSETRON HCL 4 MG PO TABS: 4.0000 mg | ORAL_TABLET | Freq: Four times a day (QID) | ORAL | Status: DC | PRN

## 2015-12-11 MED ORDER — KETOROLAC TROMETHAMINE 15 MG/ML IJ SOLN
15.0000 mg | Freq: Four times a day (QID) | INTRAMUSCULAR | Status: DC
  Administered 2015-12-11 – 2015-12-13 (×8): 15 mg via INTRAVENOUS
  Filled 2015-12-11 (×8): qty 1

## 2015-12-11 MED ORDER — ACETAMINOPHEN 10 MG/ML IV SOLN
INTRAVENOUS | Status: AC
  Filled 2015-12-11: qty 100

## 2015-12-11 MED ORDER — METHOCARBAMOL 500 MG PO TABS: 500.0000 mg | ORAL_TABLET | Freq: Four times a day (QID) | ORAL | Status: DC | PRN

## 2015-12-11 MED ORDER — PHENOL 1.4 % MT LIQD
1.0000 | OROMUCOSAL | Status: DC | PRN
Start: 2015-12-11 — End: 2015-12-13
  Filled 2015-12-11: qty 177

## 2015-12-11 MED ORDER — LIDOCAINE HCL 1 % IJ SOLN
INTRAMUSCULAR | Status: DC | PRN
  Administered 2015-12-11: 10 mL

## 2015-12-11 MED ORDER — ACETAMINOPHEN 10 MG/ML IV SOLN
INTRAVENOUS | Status: DC | PRN
  Administered 2015-12-11: 1000 mg via INTRAVENOUS

## 2015-12-11 MED ORDER — LIDOCAINE HCL (CARDIAC) 20 MG/ML IV SOLN
INTRAVENOUS | Status: DC | PRN
  Administered 2015-12-11: 80 mg via INTRAVENOUS

## 2015-12-11 MED ORDER — METOCLOPRAMIDE HCL 5 MG PO TABS: 5.0000 mg | ORAL_TABLET | Freq: Three times a day (TID) | ORAL | Status: DC | PRN

## 2015-12-11 MED ORDER — PROPOFOL 10 MG/ML IV BOLUS
INTRAVENOUS | Status: DC | PRN
  Administered 2015-12-11: 160 mg via INTRAVENOUS

## 2015-12-11 MED ORDER — ALUM & MAG HYDROXIDE-SIMETH 200-200-20 MG/5ML PO SUSP: 30.0000 mL | ORAL | Status: DC | PRN

## 2015-12-11 MED ORDER — SODIUM CHLORIDE 0.9 % IV SOLN
INTRAVENOUS | Status: DC
  Administered 2015-12-11: 18:00:00 via INTRAVENOUS

## 2015-12-11 MED ORDER — MAGNESIUM HYDROXIDE 400 MG/5ML PO SUSP
30.0000 mL | Freq: Every day | ORAL | Status: DC | PRN
  Administered 2015-12-12 – 2015-12-13 (×2): 30 mL via ORAL
  Filled 2015-12-11 (×2): qty 30

## 2015-12-11 MED ORDER — LACTATED RINGERS IV SOLN
INTRAVENOUS | Status: DC
  Administered 2015-12-11 (×2): via INTRAVENOUS

## 2015-12-11 MED ORDER — OXYCODONE HCL 5 MG PO TABS
5.0000 mg | ORAL_TABLET | ORAL | Status: DC | PRN
  Administered 2015-12-11 (×2): 5 mg via ORAL
  Administered 2015-12-12: 10 mg via ORAL
  Administered 2015-12-12 – 2015-12-13 (×3): 5 mg via ORAL
  Filled 2015-12-11: qty 1
  Filled 2015-12-11: qty 2
  Filled 2015-12-11 (×5): qty 1

## 2015-12-11 MED ORDER — DEXAMETHASONE SODIUM PHOSPHATE 10 MG/ML IJ SOLN
INTRAMUSCULAR | Status: DC | PRN
  Administered 2015-12-11: 8 mg via INTRAVENOUS

## 2015-12-11 MED ORDER — DIPHENHYDRAMINE HCL 12.5 MG/5ML PO ELIX: 12.5000 mg | ORAL_SOLUTION | ORAL | Status: DC | PRN

## 2015-12-11 MED ORDER — KETOROLAC TROMETHAMINE 30 MG/ML IJ SOLN
INTRAMUSCULAR | Status: DC | PRN
  Administered 2015-12-11: 30 mg via INTRAVENOUS

## 2015-12-11 MED ORDER — MIDAZOLAM HCL 2 MG/2ML IJ SOLN
INTRAMUSCULAR | Status: DC | PRN
  Administered 2015-12-11: 2 mg via INTRAVENOUS

## 2015-12-11 MED ORDER — PROMETHAZINE HCL 25 MG/ML IJ SOLN
6.2500 mg | Freq: Once | INTRAMUSCULAR | Status: AC
  Administered 2015-12-11: 6.25 mg via INTRAVENOUS

## 2015-12-11 MED ORDER — FENTANYL CITRATE (PF) 100 MCG/2ML IJ SOLN
INTRAMUSCULAR | Status: AC
  Administered 2015-12-11: 25 ug via INTRAVENOUS
  Filled 2015-12-11: qty 2

## 2015-12-11 MED ORDER — LIDOCAINE HCL (PF) 1 % IJ SOLN
INTRAMUSCULAR | Status: AC
  Filled 2015-12-11: qty 30

## 2015-12-11 MED ORDER — BUPIVACAINE-EPINEPHRINE (PF) 0.25% -1:200000 IJ SOLN
INTRAMUSCULAR | Status: AC
  Filled 2015-12-11: qty 30

## 2015-12-11 MED ORDER — ACETAMINOPHEN 500 MG PO TABS
1000.0000 mg | ORAL_TABLET | Freq: Four times a day (QID) | ORAL | Status: AC
  Administered 2015-12-11 – 2015-12-12 (×4): 1000 mg via ORAL
  Filled 2015-12-11 (×4): qty 2

## 2015-12-11 MED ORDER — MAGNESIUM CITRATE PO SOLN
1.0000 | Freq: Once | ORAL | Status: DC | PRN
  Filled 2015-12-11: qty 296

## 2015-12-11 MED ORDER — ONDANSETRON HCL 4 MG/2ML IJ SOLN
4.0000 mg | Freq: Four times a day (QID) | INTRAMUSCULAR | Status: DC | PRN
  Administered 2015-12-12 – 2015-12-13 (×2): 4 mg via INTRAVENOUS
  Filled 2015-12-11 (×2): qty 2

## 2015-12-11 MED ORDER — CELECOXIB 200 MG PO CAPS
200.0000 mg | ORAL_CAPSULE | Freq: Two times a day (BID) | ORAL | Status: DC
  Administered 2015-12-11 – 2015-12-13 (×4): 200 mg via ORAL
  Filled 2015-12-11 (×4): qty 1

## 2015-12-11 MED ORDER — EPINEPHRINE HCL 1 MG/ML IJ SOLN
INTRAMUSCULAR | Status: AC
  Filled 2015-12-11: qty 1

## 2015-12-11 MED ORDER — ASPIRIN EC 325 MG PO TBEC
325.0000 mg | DELAYED_RELEASE_TABLET | Freq: Every day | ORAL | Status: DC
  Administered 2015-12-12 – 2015-12-13 (×2): 325 mg via ORAL
  Filled 2015-12-11 (×2): qty 1

## 2015-12-11 MED ORDER — TOPIRAMATE 100 MG PO TABS
100.0000 mg | ORAL_TABLET | Freq: Two times a day (BID) | ORAL | Status: DC
  Administered 2015-12-11 – 2015-12-13 (×4): 100 mg via ORAL
  Filled 2015-12-11 (×6): qty 1

## 2015-12-11 MED ORDER — MENTHOL 3 MG MT LOZG
1.0000 | LOZENGE | OROMUCOSAL | Status: DC | PRN
  Filled 2015-12-11: qty 9

## 2015-12-11 MED ORDER — CEFAZOLIN SODIUM-DEXTROSE 2-3 GM-% IV SOLR
INTRAVENOUS | Status: AC
  Administered 2015-12-12: 2000 mg
  Filled 2015-12-11: qty 50

## 2015-12-11 MED ORDER — FENTANYL CITRATE (PF) 100 MCG/2ML IJ SOLN
INTRAMUSCULAR | Status: DC | PRN
  Administered 2015-12-11: 25 ug via INTRAVENOUS
  Administered 2015-12-11 (×2): 50 ug via INTRAVENOUS
  Administered 2015-12-11 (×3): 25 ug via INTRAVENOUS

## 2015-12-11 MED ORDER — NEOMYCIN-POLYMYXIN B GU 40-200000 IR SOLN
Status: DC | PRN
  Administered 2015-12-11: 4 mL

## 2015-12-11 MED ORDER — METHOCARBAMOL 1000 MG/10ML IJ SOLN
500.0000 mg | Freq: Four times a day (QID) | INTRAVENOUS | Status: DC | PRN
  Filled 2015-12-11: qty 5

## 2015-12-11 MED ORDER — PROMETHAZINE HCL 25 MG PO TABS: 25.0000 mg | ORAL_TABLET | Freq: Four times a day (QID) | ORAL | Status: DC | PRN

## 2015-12-11 MED ORDER — SCOPOLAMINE 1 MG/3DAYS TD PT72
MEDICATED_PATCH | TRANSDERMAL | Status: AC
  Filled 2015-12-11: qty 1

## 2015-12-11 MED ORDER — DOCUSATE SODIUM 100 MG PO CAPS
100.0000 mg | ORAL_CAPSULE | Freq: Two times a day (BID) | ORAL | Status: DC
  Administered 2015-12-11 – 2015-12-13 (×4): 100 mg via ORAL
  Filled 2015-12-11 (×4): qty 1

## 2015-12-11 MED ORDER — HYDROCHLOROTHIAZIDE 25 MG PO TABS
25.0000 mg | ORAL_TABLET | Freq: Every day | ORAL | Status: DC
  Administered 2015-12-13: 25 mg via ORAL
  Filled 2015-12-11 (×2): qty 1

## 2015-12-11 MED ORDER — CEFAZOLIN SODIUM 1-5 GM-% IV SOLN
1.0000 g | Freq: Four times a day (QID) | INTRAVENOUS | Status: AC
  Administered 2015-12-11 – 2015-12-12 (×2): 1 g via INTRAVENOUS
  Filled 2015-12-11 (×3): qty 50

## 2015-12-11 SURGICAL SUPPLY — 92 items
ADAPTER IRRIG TUBE 2 SPIKE SOL (ADAPTER) ×6 IMPLANT
ANCHOR BUTTON TIGHTROPE ACL RT (Orthopedic Implant) ×3 IMPLANT
ANCHOR SUPER #2 ORTHOCORD (MISCELLANEOUS) ×3 IMPLANT
BASIN GRAD PLASTIC 32OZ STRL (MISCELLANEOUS) ×3 IMPLANT
BIT DRILL PIN RETRO (DRILL) ×2 IMPLANT
BLADE SURG 15 STRL LF DISP TIS (BLADE) ×2 IMPLANT
BLADE SURG 15 STRL SS (BLADE) ×1
BLADE SURG SZ11 CARB STEEL (BLADE) ×3 IMPLANT
BNDG COHESIVE 4X5 TAN STRL (GAUZE/BANDAGES/DRESSINGS) ×3 IMPLANT
BNDG COHESIVE 6X5 TAN STRL LF (GAUZE/BANDAGES/DRESSINGS) ×3 IMPLANT
BNDG ESMARK 6X12 TAN STRL LF (GAUZE/BANDAGES/DRESSINGS) ×3 IMPLANT
BUR RADIUS 3.5 (BURR) ×3 IMPLANT
BUR RADIUS 4.0X18.5 (BURR) ×3 IMPLANT
BUR RADIUS 5.5 (BURR) ×3 IMPLANT
CLEANER CAUTERY TIP 5X5 PAD (MISCELLANEOUS) ×2 IMPLANT
COOLER POLAR GLACIER W/PUMP (MISCELLANEOUS) ×3 IMPLANT
CUTTER DUAL RETRO (MISCELLANEOUS) ×3 IMPLANT
DRAPE FLUOR MINI C-ARM 54X84 (DRAPES) ×3 IMPLANT
DRAPE IMP U-DRAPE 54X76 (DRAPES) ×6 IMPLANT
DRAPE INCISE IOBAN 66X45 STRL (DRAPES) ×3 IMPLANT
DRAPE SHEET LG 3/4 BI-LAMINATE (DRAPES) ×3 IMPLANT
DRAPE TABLE BACK 80X90 (DRAPES) ×3 IMPLANT
DRAPE U-SHAPE 47X51 STRL (DRAPES) ×3 IMPLANT
DRILL FLIPCUTTER II 8.0MM (INSTRUMENTS) IMPLANT
DRILL FLIPCUTTER II 9.0MM (INSTRUMENTS) ×2 IMPLANT
DRILL PIN RETRO (DRILL) ×3
DURAPREP 26ML APPLICATOR (WOUND CARE) ×9 IMPLANT
FLIPCUTTER II 8.0MM (INSTRUMENTS)
FLIPCUTTER II 9.0MM (INSTRUMENTS) ×3
GAUZE PETRO XEROFOAM 1X8 (MISCELLANEOUS) ×3 IMPLANT
GAUZE SPONGE 4X4 12PLY STRL (GAUZE/BANDAGES/DRESSINGS) ×3 IMPLANT
GLOVE BIOGEL PI IND STRL 9 (GLOVE) ×2 IMPLANT
GLOVE BIOGEL PI INDICATOR 9 (GLOVE) ×1
GLOVE SURG 9.0 ORTHO LTXF (GLOVE) ×6 IMPLANT
GOWN STRL REUS TWL 2XL XL LVL4 (GOWN DISPOSABLE) ×3 IMPLANT
GOWN STRL REUS W/ TWL LRG LVL3 (GOWN DISPOSABLE) ×2 IMPLANT
GOWN STRL REUS W/TWL 2XL LVL3 (GOWN DISPOSABLE) ×3 IMPLANT
GOWN STRL REUS W/TWL LRG LVL3 (GOWN DISPOSABLE) ×1
GUIDEWIRE 1.1MM (WIRE) ×3 IMPLANT
HANDLE YANKAUER SUCT BULB TIP (MISCELLANEOUS) ×3 IMPLANT
IV LACTATED RINGER IRRG 3000ML (IV SOLUTION) ×6
IV LR IRRIG 3000ML ARTHROMATIC (IV SOLUTION) ×12 IMPLANT
KIT RM TURNOVER STRD PROC AR (KITS) ×3 IMPLANT
LABEL OR SOLS (LABEL) ×3 IMPLANT
MANIFOLD NEPTUNE II (INSTRUMENTS) ×3 IMPLANT
MAT BLUE FLOOR 46X72 FLO (MISCELLANEOUS) ×6 IMPLANT
NDL SAFETY ECLIPSE 18X1.5 (NEEDLE) ×2 IMPLANT
NEEDLE FILTER BLUNT 18X 1/2SAF (NEEDLE) ×1
NEEDLE FILTER BLUNT 18X1 1/2 (NEEDLE) ×2 IMPLANT
NEEDLE HYPO 18GX1.5 SHARP (NEEDLE) ×1
NEPTUNE MANIFOLD (MISCELLANEOUS) ×3 IMPLANT
PACK ARTHROSCOPY KNEE (MISCELLANEOUS) ×3 IMPLANT
PAD ABD DERMACEA PRESS 5X9 (GAUZE/BANDAGES/DRESSINGS) ×6 IMPLANT
PAD CLEANER CAUTERY TIP 5X5 (MISCELLANEOUS) ×1
PAD GROUND ADULT SPLIT (MISCELLANEOUS) ×3 IMPLANT
PAD WRAPON POLAR KNEE (MISCELLANEOUS) ×2 IMPLANT
PENCIL ELECTRO HAND CTR (MISCELLANEOUS) ×3 IMPLANT
SCREW BIOCOMP 9X28 (Screw) ×3 IMPLANT
SET TUBE SUCT SHAVER OUTFL 24K (TUBING) ×3 IMPLANT
SET TUBE TIP INTRA-ARTICULAR (MISCELLANEOUS) ×3 IMPLANT
SOL PREP PVP 2OZ (MISCELLANEOUS) ×3
SOLUTION PREP PVP 2OZ (MISCELLANEOUS) ×2 IMPLANT
SPONGE XRAY 4X4 16PLY STRL (MISCELLANEOUS) ×3 IMPLANT
STAPLE SPIKE LIGAMENT 11X20MM (Staple) ×3 IMPLANT
STRIP CLOSURE SKIN 1/2X4 (GAUZE/BANDAGES/DRESSINGS) ×6 IMPLANT
SUCTION FRAZIER HANDLE 10FR (MISCELLANEOUS) ×1
SUCTION TUBE FRAZIER 10FR DISP (MISCELLANEOUS) ×2 IMPLANT
SUT 2 FIBERLOOP 20 STRT BLUE (SUTURE) ×6
SUT ETHILON 4-0 (SUTURE) ×1
SUT ETHILON 4-0 FS2 18XMFL BLK (SUTURE) ×2
SUT FIBERSNARE 2 CLSD LOOP (SUTURE) ×3 IMPLANT
SUT FIBERWIRE #2 38 T-5 BLUE (SUTURE) ×3
SUT MNCRL AB 4-0 PS2 18 (SUTURE) ×3 IMPLANT
SUT ORTHOCORD 2X36 W/O NDL (SUTURE) ×3 IMPLANT
SUT VIC AB 0 CT1 36 (SUTURE) ×3 IMPLANT
SUT VIC AB 2-0 CT2 27 (SUTURE) ×3 IMPLANT
SUT VIC AB 2-0 SH 27 (SUTURE) ×1
SUT VIC AB 2-0 SH 27XBRD (SUTURE) ×2 IMPLANT
SUTURE 2 FIBERLOOP 20 STRT BLU (SUTURE) ×4 IMPLANT
SUTURE ETHLN 4-0 FS2 18XMF BLK (SUTURE) ×2 IMPLANT
SUTURE FIBERWR #2 38 T-5 BLUE (SUTURE) ×2 IMPLANT
SYR BULB IRRIG 60ML STRL (SYRINGE) ×3 IMPLANT
SYRINGE 10CC LL (SYRINGE) ×6 IMPLANT
TAPE UMBIL 1/8X18 RADIOPA (MISCELLANEOUS) ×3 IMPLANT
TENDON GRACILIS FROZEN (Bone Implant) ×3 IMPLANT
TENDON GRACILIS FROZEN 230-320 (Bone Implant) ×2 IMPLANT
TENDON SEMI-TENDINOSUS (Bone Implant) ×3 IMPLANT
TUBING ARTHRO INFLOW-ONLY STRL (TUBING) ×3 IMPLANT
TUBING CONNECTING 10 (TUBING) ×3 IMPLANT
WAND HAND CNTRL MULTIVAC 50 (MISCELLANEOUS) IMPLANT
WAND HAND CNTRL MULTIVAC 90 (MISCELLANEOUS) ×3 IMPLANT
WRAPON POLAR PAD KNEE (MISCELLANEOUS) ×3

## 2015-12-11 NOTE — Op Note (Signed)
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 female with a diagnosis of complex tear of medial meniscus who failed conservative measures and elected for surgical management.    The risks benefits and alternatives were discussed with the patient preoperatively including but not limited to the risks of infection, bleeding, nerve or blood vessel injury, knee stiffness/arthrofibrosis, hardware failure, re-tear of the anterior cruciate ligament graft, persistent pain or instability, osteoarthritis and the need for revision surgery.  Medical risks include but are not limited to DVT and pulmonary embolism, stroke, pneumonia, respiratory failure and death. Patient understood these risks and wished to proceed with surgical reconstruction.   OPERATIVE IMPLANTS: Arthrex anterior cruciate ligament tightrope RC, Artherex biocomposite 9 x 28 mm tibial interference screw and 11 x 20 mm spiked staple.  OPERATIVE FINDINGS: Complete tear of the right knee anterior cruciate ligament graft, complex tear of the medial meniscus, grade 3/4 diffuse chondral wear of the medial femoral condyle and extensive grade 3/4 chondral wear of the lateral tibial plateau  OPERATIVE PROCEDURE: Patient was in the preoperative area. The right knee was marked with the word "yes" according the hospital's correct site of surgery protocol. The patient was brought to the operating room and placed in the supine position. General anesthesia was administered. 2 g of Kefzol  were given for antibiotic prophylaxis. The lower extremity was prepped and draped in usual sterile fashion.   A time out was performed to verify the patient's name, date of birth, medical record number, correct site of surgery correct procedure to be performed. It was also used to verify the patient received antibiotics and all appropriate instruments, implants and radiographs studies were available in the room. Once all in attendance were in agreement case began. A tourniquet was applied to the right upper thigh but was not inflated.  Exam under anesthesia was performed which demonstrated 10 of anterior laxity on Lachman's test. She had 5-10 mm of anterior laxity on anterior drawer test. Patient had no instability to varus valgus stress testing at 0 and 30 of flexion. Her knee range of motion was from 0 120. She did not have a significant effusion. She has a positive pivot shift.  Proposed arthroscopy incisions were drawn out with a surgical marker and pre-injected with 1% lidocaine plain. An 11 blade was used to establish an inferior medial and lateral portals. The medial portal was created under direct visualization using an 18-gauge spinal needle for localization. A full diagnostic examination of the knee was performed including the suprapatellar pouch, the patella femoral joint, medial lateral gutters, the medial and lateral compartments, the intercondylar notch in the posterior knee. The medial meniscus tear was abraded using straight and up-biting duckbill baskets and a 4.0 and 3.5 mm resector shaver blades until a stable rim was achieved.. A chondroplasty medial femoral condyle was also performed using a 4 resector shaver blade. Extensive grade 3 and 4 chondral changes were seen of medial femoral condyle. With the right knee in a figure 4 position the lateral  compartment was examined. Patient had extensive grade 3/4 changes of the tibial plateau cartilage. A chondroplasty of this area was also  performed using a 4 resector shaver blade. There was no lateral meniscus tear seen.  The anterior cruciate ligament fibers were debrided with a 4.0 mm resector shaver blade. A 5.5 mm resector shaver blade was used perform a notchplasty. Allograft hamstrings were used for this case. Graft length was 130 mm. The combined 4 stranded hamstring allograft diameter was 9 mm throughout its length. The allograft was whipstitched with fiber loop on both ends of each graft. Graft was then placed on the Graftmaster with 15 mmHg attention. It was kept moist with a Ray-Tec.  The attention was then turned to tunnel creation. The femoral tunnel cutting guide was then placed through the lateral portal. The arthroscope was placed in the medial portal at this point. The intercondylar distance was measured at 35 mm. A size 9mm flip cutter drill guide was advanced into the intercondylar notch. The blade was engaged and the femoral tunnel was created in a retrograde fashion to 30 mm. A fiber stick suture was placed through the femoral tunnel brought out the lateral portal and clamped for later graft passage.   The attention was then turned to tibial tunnel creation.  A longitudinal incision was made over the anteromedial proximal tibia.  A fixed angle tibial retro-drill guide was then placed through the anteromedial portal. A drill pin was advanced into the anterior tibia and advanced until it engaged the 9 mm retrodrill bit. A tibial tunnel was then created in a retrograde fashion. The fiber stick loop was brought out through the tibial tunnel. The 4 stranded hamstring tibial autograft was then shuttled through the knee using the fiber stick loop. Once the button was flipped on the lateral femoral cortex FluoroScan image was taken to confirm it was laying flat against the lateral cortex of the femur. Once this was confirmed the hamstring graft was advanced into position using the white suture ends of the Arthrex tight rope RC  button. The graft was bottomed out into the femoral tunnel. The knee was then cycled 25 times to remove creep. The knee was then flexed approximately 30. An Arthrex bio composite interference screw 9 x 20 mm was then advanced into position with countertraction on the tibial side of the graft and a posterior drawer force directed to the tibia. Once the interference screw was in position, an 11 mm spiked staple was placed over the distal end of the graft on the tibial side as backup fixation.  The patient had a firm endpoint without anterior laxity on Lachman's test. His range of motion remains 0-120. Final arthroscopic images of the graft were taken. He should have no graft impingement in full extension. The wounds were copiously irrigated. The subcutaneous tissue of the tibial incision was closed with a 2-0 Vicryl and the skin was approximated with a running 4-0 Monocryl. The arthroscopy portal incisions were closed with 4-0 nylon along with the small stab incision over the lateral femur used for placement of the femoral tunnel.  Patient had a dry sterile dressing applied along with Steri-Strips and Xeroform.The right knee was injected with 0.25% Marcaine plain for postop pain control. TENS unit pads were applied along with a Polar Care sleeve over thigh high TED stockings.  A Breg hinged knee brace locked in extension was also placed on the right knee. The incisions and the joint were injected with 4% Marcaine plain.  Patient  had a Polar Care sleeve applied to the right knee, along with TENS unit leads and a hinged knee brace locked in extension. The patient was brought to the PACU in stable condition. I was scrubbed and present the entire case and all sharp and instrument counts were correct at conclusion the case.   The patient was awakened and brought to PACU in stable condition. I spoke with the patient's husband in the postop consultation room to let him know that patient was stable in recovery room the  case had been performed without complication.

## 2015-12-11 NOTE — Discharge Instructions (Signed)

## 2015-12-11 NOTE — H&P (Signed)
The patient has been re-examined, and the chart reviewed, and there have been no interval changes to the documented history and physical.    The risks, benefits, and alternatives have been discussed at length, and the patient is willing to proceed.   

## 2015-12-11 NOTE — Transfer of Care (Signed)
Immediate Anesthesia Transfer of Care Note  Patient: Renne CriglerRobin H Olivero  Procedure(s) Performed: Procedure(s): KNEE ARTHROSCOPY WITH REVISION OF ANTERIOR CRUCIATE LIGAMENT (ACL) REPAIR  (Right)  Patient Location: PACU  Anesthesia Type:General  Level of Consciousness: awake, alert  and oriented  Airway & Oxygen Therapy: Patient Spontanous Breathing and Patient connected to face mask oxygen  Post-op Assessment: Report given to RN and Post -op Vital signs reviewed and stable  Post vital signs: Reviewed and stable  Last Vitals:  Filed Vitals:   12/11/15 1204  BP: 106/62  Pulse: 85  Temp: 36.6 C  Resp: 16    Complications: No apparent anesthesia complications

## 2015-12-11 NOTE — Anesthesia Postprocedure Evaluation (Signed)
Anesthesia Post Note  Patient: Renne CriglerRobin H Burgher  Procedure(s) Performed: Procedure(s) (LRB): KNEE ARTHROSCOPY WITH REVISION OF ANTERIOR CRUCIATE LIGAMENT (ACL) REPAIR  (Right)  Patient location during evaluation: PACU Anesthesia Type: General Level of consciousness: awake and alert Pain management: pain level controlled Vital Signs Assessment: post-procedure vital signs reviewed and stable Respiratory status: spontaneous breathing, nonlabored ventilation, respiratory function stable and patient connected to nasal cannula oxygen Cardiovascular status: blood pressure returned to baseline and stable Anesthetic complications: no Comments: Patient is having a lot of nausea with vomiting.  She received zofran and decadron intraop and upon entering the PACU has gotten another dose of zofran and 2 doses of phenergan.  As a last ditch effort we placed a scopolamine patch, but will likely not see the effects of this until 2-3 hours.  Dr. Martha ClanKrasinski is aware of the situation and has admitted the patient while we continue to work on getting her nausea under control.  I did offer to give the patient a small dose of propofol to help, but the patient refused.    Last Vitals:  Filed Vitals:   12/11/15 1710 12/11/15 1716  BP: 99/66   Pulse: 83 77  Temp: 36.7 C   Resp: 24 17    Last Pain:  Filed Vitals:   12/11/15 1729  PainSc: 5                  Lenard SimmerAndrew Nesta Kimple

## 2015-12-11 NOTE — Progress Notes (Signed)
Pt. Hurting 10 out of 10 after receiving oxy and morphine. Dr. Martha ClanKrasinski ordered to d/c morphine and start dilaudid 1mg  q3h prn and toradol 15mg  q6h x4 doses.

## 2015-12-11 NOTE — Anesthesia Preprocedure Evaluation (Signed)
Anesthesia Evaluation  Patient identified by MRN, date of birth, ID band Patient awake    Reviewed: Allergy & Precautions, NPO status , Patient's Chart, lab work & pertinent test results  History of Anesthesia Complications (+) PONV  Airway Mallampati: II       Dental  (+) Teeth Intact   Pulmonary former smoker,    + rhonchi        Cardiovascular Exercise Tolerance: Good  Rhythm:Regular Rate:Normal     Neuro/Psych Seizures -, Well Controlled,  TIA   GI/Hepatic negative GI ROS, Neg liver ROS,   Endo/Other  negative endocrine ROS  Renal/GU      Musculoskeletal negative musculoskeletal ROS (+)   Abdominal Normal abdominal exam  (+)   Peds negative pediatric ROS (+)  Hematology negative hematology ROS (+)   Anesthesia Other Findings   Reproductive/Obstetrics                             Anesthesia Physical Anesthesia Plan  ASA: II  Anesthesia Plan: General   Post-op Pain Management:    Induction: Intravenous  Airway Management Planned: LMA  Additional Equipment:   Intra-op Plan:   Post-operative Plan: Extubation in OR  Informed Consent: I have reviewed the patients History and Physical, chart, labs and discussed the procedure including the risks, benefits and alternatives for the proposed anesthesia with the patient or authorized representative who has indicated his/her understanding and acceptance.     Plan Discussed with: CRNA  Anesthesia Plan Comments:         Anesthesia Quick Evaluation

## 2015-12-12 ENCOUNTER — Encounter: Payer: Self-pay | Admitting: Orthopedic Surgery

## 2015-12-12 DIAGNOSIS — S83231A Complex tear of medial meniscus, current injury, right knee, initial encounter: Secondary | ICD-10-CM | POA: Diagnosis not present

## 2015-12-12 MED ORDER — SODIUM CHLORIDE 0.9 % IV SOLN
Freq: Once | INTRAVENOUS | Status: AC
  Administered 2015-12-12: 10:00:00 via INTRAVENOUS

## 2015-12-12 NOTE — Progress Notes (Signed)
Spoke to Dr Martha ClanKrasinski about pts BP of 89/51  HR 52. Per MD give 500 cc of NS fluids. Will continue to monitor.

## 2015-12-12 NOTE — Care Management Note (Signed)
Case Management Note  Patient Details  Name: Charlotte Hernandez MRN: [id number] Date of Birth: [birth year]  Subjective/Objective:                  Met with patient to discuss discharge planning. She would like to return home at discharge with her husband and 32 year son. She requests a wheelchair with right leg extension that she claims she discussed with Dr. Mack Guise. Patient suffers from right leg ACL injury status post reconstruction which impairs their ability to perform daily activities like toileting, feeding, dressing, grooming, bathing in the home. A cane, walker, crutch will not resolve issue with performing activities of daily living. A wheelchair will allow patient to safely perform daily activities.  Patient can safely propel the wheelchair in the home or has a caregiver who can provide assistance. She states she has a rolling walker available at home. She may need EMS to get into her home although she wants to work with PT on "scooting up stairs on her behind". She used [facility] in the past for HHPT. She uses [facility] and [facility] on [address] for Rx.  Action/Plan:  List of home health agencies provided- unsure if patient will require HHPT at this time. I have requested price check on wheelchair with right leg extension through [place] although she exclaimed that [facility] was not able to provide the leg extension one- year ago when she needed a wheelchair (which she returned to [facility] when she was finished with it). RNCM will continue to follow.   Expected Discharge Date:  12/11/15               Expected Discharge Plan:     In-House Referral:     Discharge planning Services  CM Consult  Post Acute Care Choice:  Durable Medical Equipment, Home Health Choice offered to:  Patient  DME Arranged:    DME Agency:  [place]:  PT [facility] Agency:     Status of Service:  In process, will continue to follow  Medicare  Important Message Given:    Date Medicare IM Given:    Medicare IM give by:    Date Additional Medicare IM Given:    Additional Medicare Important Message give by:     If discussed at [place] of Stay Meetings, dates discussed:    Additional Comments:  Marshell Garfinkel, RN 12/12/2015, 11:23 AM

## 2015-12-12 NOTE — Care Management (Signed)
RX obtained from Dr. Deeann SaintHoward Miller for Standard wheelchair with right leg extension. Patient wishes to wait on wheelchair. Rx delivered to patient to obtain wheelchair. PT said she did well with stairs on her behind and that she should not need EMS to get into her home.

## 2015-12-12 NOTE — Progress Notes (Signed)
  Subjective:  POD #1 s/p right knee ACL revision.  Patient reports pain as moderate.  Patient has been hypotensive today.  Fluid bolus given.  SBP improved into the 90's.  Objective:   VITALS:   Filed Vitals:   12/12/15 0915 12/12/15 1142 12/12/15 1148 12/12/15 1340  BP: 88/58 99/47 91/59  97/54  Pulse:  58 60 63  Temp:      TempSrc:      Resp:      Height:      Weight:      SpO2:        PHYSICAL EXAM: Right lower extremity exam demonstrates: Neurologically intact Neurovascular intact Sensation intact distally Intact pulses distally Dorsiflexion/Plantar flexion intact No cellulitis present Compartment soft  LABS  No results found for this or any previous visit (from the past 24 hour(s)).  No results found.  Assessment/Plan: 1 Day Post-Op   Active Problems:   S/P ACL reconstruction  Patient with post-op hypotension.  Still significant pain.  Patient will remain overnight for BP monitoring.      Juanell FairlyKRASINSKI, Elsworth Ledin , MD 12/12/2015, 3:12 PM

## 2015-12-12 NOTE — Evaluation (Addendum)
Physical Therapy Evaluation Patient Details Name: Charlotte Hernandez MRN: [id number] DOB: [birth year] Today's Date: 12/12/2015   History of Present Illness  Pt admitted for low blood pressure s/p R knee ACL reconstruction along with R tear of medial meniscus. Pt with history of L knee ACL repair last year. Pt with continued low BP, however after fluid bolus, BP has improved to 97/54 and pt cleared to work with therapy.  Clinical Impression  Pt is a pleasant 50 year old female who was admitted for low BP s/p R knee ACL reconstruction. Pt performs bed mobility with supervision, transfers with cga, and ambulation with cga using rw. Pt performed safe technique with stair training scooting up on her bottom. Pt is confident that husband can help assist this technique at home. Pt is very motivated to perform therapy. Pt is able to ambulate short distances, however would benefit from use of WC for further distances. Pt demonstrates deficits with strength/balance/mobility. Would benefit from skilled PT to address above deficits and promote optimal return to PLOF      Follow Up Recommendations Outpatient PT    Equipment Recommendations  Wheelchair (measurements PT)    Recommendations for Other Services       Precautions / Restrictions Precautions Precautions: Fall Required Braces or Orthoses:  (R LE knee extension brace) Restrictions Weight Bearing Restrictions: Yes RLE Weight Bearing: Touchdown weight bearing Other Position/Activity Restrictions: Per verbal order from ortho MD      Mobility  Bed Mobility Overal bed mobility: Needs Assistance Bed Mobility: Supine to Sit     Supine to sit: Supervision     General bed mobility comments: supervision noted with bed mobility. Safe technique performed  Transfers Overall transfer level: Needs assistance Equipment used: Rolling walker (2 wheeled) Transfers: Sit to/from Stand Sit to Stand: Min guard         General transfer comment:  transfers performed with safe technique. Pt with increased pain while standing, performs NWB at this time  Ambulation/Gait Ambulation/Gait assistance: Min guard Ambulation Distance (Feet): 10 Feet Assistive device: Rolling walker (2 wheeled) Gait Pattern/deviations: Step-to pattern     General Gait Details: ambulated with safe technique while using rw. Pt maintains NWB on R LE despite cues for TTWB. Pt with step to gait pattern, fatigues quickly with exertion.   Stairs Stairs: Yes Stairs assistance: Min assist Stair Management: Seated/boosting Number of Stairs: 4 General stair comments: Was able to scoot up 4 stairs with min assist for sit<>Stand using B rails. safe technique performed and pt reports feeling confident with stair training.  Wheelchair Mobility    Modified Rankin (Stroke Patients Only)       Balance Overall balance assessment: Needs assistance Sitting-balance support: Feet supported Sitting balance-Leahy Scale: Normal     Standing balance support: Bilateral upper extremity supported Standing balance-Leahy Scale: Good                               Pertinent Vitals/Pain Pain Assessment: Faces Faces Pain Scale: Hurts even more Pain Location: R knee Pain Descriptors / Indicators: Operative site guarding Pain Intervention(s): Limited activity within patient's tolerance;Ice applied    Home Living Family/patient expects to be discharged to:: Private residence Living Arrangements: Spouse/significant other Available Help at Discharge: Family Type of Home: House Home Access: Stairs to enter Entrance Stairs-Rails:  (1 rail) Secretary/administrator of Steps: 8 Home Layout: Two level Home Equipment: None;Bedside commode      Prior  Function Level of Independence: Independent               Hand Dominance        Extremity/Trunk Assessment   Upper Extremity Assessment: Overall WFL for tasks assessed           Lower Extremity  Assessment: RLE deficits/detail RLE Deficits / Details: grossly 2+/5 and painful       Communication   Communication: No difficulties  Cognition Arousal/Alertness: Awake/alert Behavior During Therapy: WFL for tasks assessed/performed Overall Cognitive Status: Within Functional Limits for tasks assessed                      General Comments      Exercises Other Exercises Other Exercises: Pt able to perform SLR on L LE with independence and R LE with min assist x 10 reps.      Assessment/Plan    PT Assessment Patient needs continued PT services  PT Diagnosis Difficulty walking;Generalized weakness;Acute pain;Abnormality of gait   PT Problem List Decreased strength;Decreased activity tolerance;Decreased balance;Decreased mobility;Pain  PT Treatment Interventions Gait training;Therapeutic exercise   PT Goals (Current goals can be found in the Care Plan section) Acute Rehab PT Goals Patient Stated Goal: to go home PT Goal Formulation: With patient Time For Goal Achievement: 12/26/15 Potential to Achieve Goals: Good Additional Goals Additional Goal #1: Pt will be able to perform bed mobility/transfers with independence using rw in order to improve functional independence    Frequency BID   Barriers to discharge        Co-evaluation               End of Session Equipment Utilized During Treatment: Gait belt Activity Tolerance: Patient limited by pain Patient left: in bed;with bed alarm set Nurse Communication: Mobility status    Functional Assessment Tool Used: clinical judgement Functional Limitation: Mobility: Walking and moving around Mobility: Walking and Moving Around Current Status ([id number]): At least 40 percent but less than 60 percent impaired, limited or restricted Mobility: Walking and Moving Around Goal Status [phone]): At least 20 percent but less than 40 percent impaired, limited or restricted    Time: [id number] PT Time Calculation (min)  (ACUTE ONLY): 36 min   Charges:   PT Evaluation $PT Eval Moderate Complexity: 1 Procedure PT Treatments $Gait Training: 8-22 mins   PT G Codes:   PT G-Codes **NOT FOR INPATIENT CLASS** Functional Assessment Tool Used: clinical judgement Functional Limitation: Mobility: Walking and moving around Mobility: Walking and Moving Around Current Status ([id number]([id number]): At least 40 percent but less than 60 percent impaired, limited or restricted Mobility: Walking and Moving Around Goal Status [phone]([id number]): At least 20 percent but less than 40 percent impaired, limited or restricted    Coleby Yett 12/12/2015, 5:35 PM  Elizabeth PalauStephanie Sadae Arrazola, PT, DPT [phone]

## 2015-12-12 NOTE — Progress Notes (Signed)
PT Cancellation Note  Patient Details Name: Charlotte Hernandez MRN: [id number] DOB: 11/08/1966   Cancelled Treatment:    Reason Eval/Treat Not Completed: Other (comment). Chart reviewed, however pt with low BP this AM (89/51; followed by 88/58). Per RN, pt is not appropriate for OOB mobility at this time. Will continue to assess and evaluate possibly in PM.   Tylie Golonka 12/12/2015, 10:48 AM  Elizabeth PalauStephanie Samyria Rudie, PT, DPT [phone]

## 2015-12-12 NOTE — Progress Notes (Signed)
Pts. B/p is 98/52. Dilaudid held. Pt. Repositioned and resting comfortably at this time

## 2015-12-12 NOTE — Progress Notes (Signed)
Pts BP 89/51, HR 52. MD Martha ClanKrasinski paged. Waiting on return call.

## 2015-12-12 NOTE — Progress Notes (Signed)
Pt. Had restless night. Blood pressure started dropping with narcotics. Pt. Still c/o severe pain in right leg. Pts. Leg repositioned and pt. Given toradol IV along with scheduled tylenol. Polarcare on and running. Pt. Using bedpan. Neurochecks WDL. Pt. Reports torodol is helping with her pain more than anything she has taken. Will continue to monitor.

## 2015-12-13 ENCOUNTER — Encounter: Payer: Self-pay | Admitting: Orthopedic Surgery

## 2015-12-13 DIAGNOSIS — S83231A Complex tear of medial meniscus, current injury, right knee, initial encounter: Secondary | ICD-10-CM | POA: Diagnosis not present

## 2015-12-13 MED ORDER — OXYCODONE HCL 5 MG PO TABS: 5.0000 mg | ORAL_TABLET | ORAL | Status: DC | PRN

## 2015-12-13 NOTE — Progress Notes (Signed)
OT Cancellation Note  Patient Details Name: Charlotte Hernandez MRN: [id number] DOB: 04/15/1966   Cancelled Treatment:    Reason Eval/Treat Not Completed: OT screened, no needs identified, will sign off. Patient has done this before and knows what to expect. Patient and husband agree no Occupational Therapy needs.  Ocie CornfieldHuff, Derrin Currey M 12/13/2015, 2:32 PM

## 2015-12-13 NOTE — Discharge Summary (Signed)
Physician Discharge Summary  Patient ID: Charlotte CriglerRobin H Hernandez MRN: [id number] DOB/AGE: 50/01/1966 50 y.o.  Admit date: 12/11/2015 Discharge date: 12/13/2015  Admission Diagnoses:  complex tear of medial meniscus Anterior cruciate ligament tear  Discharge Diagnoses:  complex tear of medial meniscus Active Problems:   S/P ACL reconstruction   Past Medical History  Diagnosis Date  . TIA (transient ischemic attack)   . Fibromyalgia   . Chronic kidney disease     H/O KIDNEY STONES  . PONV (postoperative nausea and vomiting)   . Seizures (HCC)     LAST SEIZURE 2012 OR 2013-POSSIBLE SEIZURE 10-2015 BUT UNSURE-ADMITTED TO [facility] X 2 DAYS-POSSIBLE SEIZURE VS TIA-F[facility]    Surgeries: Procedure(s): KNEE ARTHROSCOPY WITH REVISION OF ANTERIOR CRUCIATE LIGAMENT (ACL) REPAIR  on 12/11/2015   Discharged Condition: Improved  Hospital Course: Charlotte CriglerRobin H Hernandez is an 50 y.o. female who was admitted 12/11/2015 with a diagnosis of re-tear of ACL graft and complex tear of medial meniscus  and went to the operating room on 12/11/2015 and underwent revision anterior cruciate ligament reconstruction with partial medial meniscectomy.  Patient had severe nausea and vomiting postoperatively. She was admitted to the hospital for management of her emesis postoperatively. Postoperative day #1 the patient was noted to have hypotension. She was given IV fluid support. Patient also experienced significant postoperative pain which was treated with IV Dilaudid and oxycodone by mouth. On postop day #2 patient's condition hasn't significantly improved. Her pain was under control with by mouth oxycodone and her blood pressure had improved. She was no longer having nausea or vomiting. Given the patient's clinical improvement she is prepared for discharge home.    She was given perioperative antibiotics:  Anti-infectives    Start     Dose/Rate Route Frequency Ordered Stop   12/11/15 1800   ceFAZolin (ANCEF) IVPB 1 g/50 mL premix     1 g 100 mL/hr over 30 Minutes Intravenous Every 6 hours 12/11/15 1756 12/12/15 0053   12/11/15 1200  ceFAZolin (ANCEF) IVPB 2 g/50 mL premix     2 g 100 mL/hr over 30 Minutes Intravenous  Once 12/11/15 1149 12/11/15 1312   12/11/15 1135  ceFAZolin (ANCEF) 2-3 GM-% IVPB SOLR    Comments:  Leilani Ableobinson, Pat: cabinet override      12/11/15 1135 12/12/15 0014    .  She was given sequential compression devices, early ambulation, and ECASA 325mg  PO daily for DVT prophylaxis.  She benefited maximally from the hospital stay and there were no complications.    Recent vital signs:  Filed Vitals:   12/13/15 0830 12/13/15 1045  BP: 97/53 107/59  Pulse: 63 62  Temp: 98 F (36.7 C) 97.8 F (36.6 C)  Resp: 18 18    Recent laboratory studies:  Lab Results  Component Value Date   HGB 11.9* 10/20/2015   HGB 13.9 10/19/2015   HGB 13.6 09/17/2014   Lab Results  Component Value Date   WBC 4.4 10/20/2015   PLT 208 10/20/2015   Lab Results  Component Value Date   INR 1.02 12/06/2015   Lab Results  Component Value Date   NA 139 10/20/2015   K 3.7 12/06/2015   CL 111 10/20/2015   CO2 22 10/20/2015   BUN 11 10/20/2015   CREATININE 0.59 10/20/2015   GLUCOSE 103* 10/20/2015    Discharge Medications:     Medication List    TAKE these medications  acetaminophen 325 MG tablet  Commonly known as:  TYLENOL  Take 650 mg by mouth every 6 (six) hours as needed. Reported on 12/11/2015     aspirin 325 MG tablet  Take 325 mg by mouth daily.     diphenhydramine-acetaminophen 25-500 MG Tabs tablet  Commonly known as:  TYLENOL PM  Take 1 tablet by mouth at bedtime.     ibuprofen 200 MG tablet  Commonly known as:  ADVIL,MOTRIN  Take 200 mg by mouth every 6 (six) hours as needed.     omeprazole 40 MG capsule  Commonly known as:  PRILOSEC  Take 40 mg by mouth every morning.     promethazine 25 MG tablet  Commonly known as:  PHENERGAN   Take 25 mg by mouth every 6 (six) hours as needed. For nausea.     Turmeric 500 MG Tabs  Take 1 tablet by mouth daily at 12 noon.     varenicline 1 MG tablet  Commonly known as:  CHANTIX  Take 1 mg by mouth 2 (two) times daily.     Vitamin B-12 CR 1000 MCG Tbcr  Take 1,000 mcg by mouth daily.      ASK your doctor about these medications        hydrochlorothiazide 25 MG tablet  Commonly known as:  HYDRODIURIL  Take 25 mg by mouth daily.     topiramate 100 MG tablet  Commonly known as:  TOPAMAX  Take 100 mg by mouth 2 (two) times daily.        Diagnostic Studies: Mr Knee Right Wo Contrast  11/19/2015  CLINICAL DATA:  Progressive right knee pain with locking and popping. Previous ACL reconstruction in 1996. EXAM: MRI OF THE RIGHT KNEE WITHOUT CONTRAST TECHNIQUE: Multiplanar, multisequence MR imaging of the knee was performed. No intravenous contrast was administered. COMPARISON:  None. FINDINGS: MENISCI Medial meniscus: There is a complex tear of the midbody with the torn component flipped into the superior gutter best seen on images 11 and 12 of series 6 in images 19 through 21 of series 3. There is also complex tear of the posterior horn with a component flipped above the meniscal root. Lateral meniscus:  Normal. LIGAMENTS Cruciates: PCL is intact. The ACL graft appears to be completely disrupted. Collaterals:  Normal. CARTILAGE Patellofemoral:  Normal. Medial:  Minimal irregularity of the articular cartilage. Lateral:  Normal. Joint:  Minimal joint effusion. Popliteal Fossa: No Baker's cyst. Complex 2 cm cyst lying posterior to the lateral femoral condyle adjacent to the origin of the lateral head of the gastrocnemius. Small complex ganglion cyst posterior to the posterior cruciate ligament. Extensor Mechanism:  Normal. Bones:  Small tricompartmental marginal osteophytes. IMPRESSION: 1. Tears of the midbody and posterior horn of the medial meniscus with flipped torn components as  described above. 2. The ACL graft appears to be completely disrupted. Electronically Signed   By: Francene Boyers M.D.   On: 11/19/2015 12:39    Disposition: 01-Home or Self Care      Discharge Instructions    Call MD / Call 911    Complete by:  As directed   If you experience chest pain or shortness of breath, CALL 911 and be transported to the hospital emergency room.  If you develope a fever above 101 F, pus (white drainage) or increased drainage or redness at the wound, or calf pain, call your surgeon's office.     Constipation Prevention    Complete by:  As directed  Drink plenty of fluids.  Prune juice may be helpful.  You may use a stool softener, such as Colace (over the counter) 100 mg twice a day.  Use MiraLax (over the counter) for constipation as needed.     Diet general    Complete by:  As directed      Discharge instructions    Complete by:  As directed   Patient is touchdown weightbearing on her right lower extremity. She should use a walker or crutches for assistance with ambulation. She may use use a wheelchair with a right leg support as well. Patient should keep her right bandage on until follow-up in the office. I recommend she continue using TED stockings until follow-up as well. She should continue to wear her right hinged knee brace locked in extension until follow-up. Patient may shower but should leave the brace on and cover the brace and bandage with a plastic trash bag during her shower.     Do not put a pillow under the knee. Place it under the heel.    Complete by:  As directed      Driving restrictions    Complete by:  As directed   No driving for 4-54 weeks     Increase activity slowly as tolerated    Complete by:  As directed      Lifting restrictions    Complete by:  As directed   No lifting for 12 weeks     TED hose    Complete by:  As directed   Use stockings (TED hose) for 2 weeks on both leg(s).  You may remove them at night for sleeping.               Signed: Juanell Fairly ,MD 12/13/2015, 3:01 PM

## 2015-12-13 NOTE — Progress Notes (Signed)
  Subjective:  Postoperative day #2 status post right revision anterior cruciate ligament reconstruction complicated by hypotension. Patient reports pain as moderate.    Objective:   VITALS:   Filed Vitals:   12/12/15 2008 12/13/15 0540 12/13/15 0830 12/13/15 1045  BP: 107/67 108/51 97/53 107/59  Pulse: 61 46 63 62  Temp: 98 F (36.7 C) 96.3 F (35.7 C) 98 F (36.7 C) 97.8 F (36.6 C)  TempSrc: Oral Axillary Oral Oral  Resp: 18 18 18 18   Height:      Weight:      SpO2: 100% 100% 100% 99%    PHYSICAL EXAM: Right lower extremity: Patient's dressing is clean dry and intact. The hinged knee brace is in place. She has palpable pedal pulses. She has intact sensation to light touch throughout the right lower extremity and can flex and extend her toes and dorsiflex and put her flex her ankle.   LABS  No results found for this or any previous visit (from the past 24 hour(s)).  No results found.  Assessment/Plan: 2 Days Post-Op   Active Problems:   S/P ACL reconstruction  Patient's blood pressure is improved today. Patient clinically is stable. Her pain is controlled on oxycodone. She is being prepared for discharge this afternoon. She'll follow up with me in the office in one week.    Juanell FairlyKRASINSKI, Warnell Rasnic , MD 12/13/2015, 2:59 PM

## 2015-12-13 NOTE — Progress Notes (Signed)
Physical Therapy Treatment Patient Details Name: Charlotte Hernandez MRN: [id number] DOB: [birth year] Today's Date: 12/13/2015    History of Present Illness Pt admitted for low blood pressure s/p R knee ACL reconstruction along with R tear of medial meniscus. Pt with history of L knee ACL repair last year. Pt with continued low BP, however after fluid bolus, BP has improved to 97/54 and pt cleared to work with therapy.    PT Comments    Pt does well with PT session and though she continues to have hypotension is generally feeling better and is eager to go home.  She is able to do SLR w/o assist and though she has some pain she generally does well.  Pt does not need assist with getting to EOB or with to/from sitting.  Pt overall shows good motivation and safety with mobility acts.   Follow Up Recommendations  Outpatient PT     Equipment Recommendations  Wheelchair (measurements PT)    Recommendations for Other Services       Precautions / Restrictions Precautions Precautions: Fall Restrictions Weight Bearing Restrictions: Yes RLE Weight Bearing: Touchdown weight bearing    Mobility  Bed Mobility Overal bed mobility: Modified Independent Bed Mobility: Supine to Sit     Supine to sit: Supervision     General bed mobility comments: Pt able to use hand to assist leg to EOB, able to rise w/o direct assist  Transfers Overall transfer level: Modified independent Equipment used: Rolling walker (2 wheeled) Transfers: Sit to/from Stand Sit to Stand: Min guard         General transfer comment: Pt able to rise w/o issue, has some lightheadedness but no LOBs or safety issues  Ambulation/Gait Ambulation/Gait assistance: Min guard Ambulation Distance (Feet): 30 Feet Assistive device: Rolling walker (2 wheeled)       General Gait Details: Pt able to maintain very minimal TTWBing on R and shows appropriate use of walker to take weight.  She displays good confidence with mobility,  though she does have some lightheadedness t/o the effort.  BP was 107/48 in sitting after walking.   Stairs            Wheelchair Mobility    Modified Rankin (Stroke Patients Only)       Balance                                    Cognition Arousal/Alertness: Awake/alert Behavior During Therapy: WFL for tasks assessed/performed Overall Cognitive Status: Within Functional Limits for tasks assessed                      Exercises General Exercises - Lower Extremity Ankle Circles/Pumps: Strengthening;AROM;10 reps (resisted DF) Quad Sets: Strengthening;10 reps Gluteal Sets: Strengthening;10 reps Hip ABduction/ADduction: Strengthening;10 reps Straight Leg Raises: AROM;10 reps;AAROM    General Comments        Pertinent Vitals/Pain Pain Assessment: 0-10 Pain Score: 7     Home Living                      Prior Function            PT Goals (current goals can now be found in the care plan section) Progress towards PT goals: Progressing toward goals    Frequency  BID    PT Plan Current plan remains appropriate    Co-evaluation  End of Session Equipment Utilized During Treatment: Gait belt Activity Tolerance: Patient limited by pain Patient left: in bed;with bed alarm set     Time: [id number] PT Time Calculation (min) (ACUTE ONLY): 26 min  Charges:  $Gait Training: 8-22 mins $Therapeutic Exercise: 8-22 mins                    G Codes:     Loran SentersGalen Derenda Giddings, PT, DPT [phone]  Malachi ProGalen R Adaline Trejos 12/13/2015, 12:29 PM

## 2015-12-13 NOTE — Progress Notes (Signed)
Patient being discharged to home with [facility]. Discharge & Rx instructions given to patient. IV removed; belongings packed. VSS at time of discharge.

## 2015-12-13 NOTE — Progress Notes (Signed)
Physical Therapy Treatment Patient Details Name: Charlotte Hernandez MRN: [id number] DOB: 1966/06/20 Today's Date: 12/13/2015    History of Present Illness Pt admitted for low blood pressure s/p R knee ACL reconstruction along with R tear of medial meniscus. Pt with history of L knee ACL repair last year. Pt with continued low BP, however after fluid bolus, BP has improved to 97/54 and pt cleared to work with therapy.    PT Comments    Pt continues to have some dizziness, but her BP has improved somewhat.  She shows good effort and needs little assist with mobility, etc  She is very tired from lack of sleep the last 2 days but shows good effort with increased ambulation distance and with exercises.   Follow Up Recommendations  Outpatient PT     Equipment Recommendations  Wheelchair (measurements PT)    Recommendations for Other Services       Precautions / Restrictions Precautions Precautions: Fall Restrictions RLE Weight Bearing: Touchdown weight bearing    Mobility  Bed Mobility Overal bed mobility: Modified Independent Bed Mobility: Sit to Supine;Supine to Sit     Supine to sit: Supervision Sit to supine: Min guard   General bed mobility comments: Pt able to get into and out of bed with no extra assist.  She does need some extra time and effort getting leg off/on bed.  Transfers Overall transfer level: Modified independent Equipment used: Rolling walker (2 wheeled) Transfers: Sit to/from Stand Sit to Stand: Min guard         General transfer comment: Pt able to get to standing with good confidence, does not have as much dizziness this afternoon.  Ambulation/Gait Ambulation/Gait assistance: Min guard Ambulation Distance (Feet): 60 Feet Assistive device: Rolling walker (2 wheeled)       General Gait Details: Pt again has good effort and does not need to stop and take standing rest breaks.  Pt is able to maintain TTWBing well and does not report increase pain  (just lightheadedness) during session.    Stairs            Wheelchair Mobility    Modified Rankin (Stroke Patients Only)       Balance                                    Cognition Arousal/Alertness: Awake/alert Behavior During Therapy: WFL for tasks assessed/performed Overall Cognitive Status: Within Functional Limits for tasks assessed                      Exercises General Exercises - Lower Extremity Ankle Circles/Pumps: Strengthening;AROM;10 reps Quad Sets: Strengthening;10 reps Gluteal Sets: Strengthening;10 reps Hip ABduction/ADduction: Strengthening;10 reps Straight Leg Raises: AROM;10 reps;AAROM    General Comments        Pertinent Vitals/Pain Pain Score: 8     Home Living                      Prior Function            PT Goals (current goals can now be found in the care plan section) Progress towards PT goals: Progressing toward goals    Frequency  BID    PT Plan Current plan remains appropriate    Co-evaluation             End of Session Equipment Utilized During Treatment: Gait belt Activity Tolerance:  Patient limited by pain Patient left: in bed;with bed alarm set     Time: 1610-9604 PT Time Calculation (min) (ACUTE ONLY): 25 min  Charges:  $Gait Training: 8-22 mins $Therapeutic Exercise: 8-22 mins                    G Codes:     Loran Senters, PT, DPT [phone]  Malachi Pro 12/13/2015, 4:25 PM

## 2016-02-04 ENCOUNTER — Other Ambulatory Visit: Payer: Self-pay | Admitting: Physician Assistant

## 2016-02-04 DIAGNOSIS — Z1231 Encounter for screening mammogram for malignant neoplasm of breast: Secondary | ICD-10-CM

## 2016-02-05 ENCOUNTER — Ambulatory Visit
Admission: RE | Admit: 2016-02-05 | Discharge: 2016-02-05 | Disposition: A | Discharge status: Home / Self Care | Payer: BLUE CROSS/BLUE SHIELD | Source: Ambulatory Visit | Attending: Physician Assistant | Admitting: Physician Assistant

## 2016-02-05 DIAGNOSIS — Z1231 Encounter for screening mammogram for malignant neoplasm of breast: Secondary | ICD-10-CM | POA: Insufficient documentation

## 2016-06-04 ENCOUNTER — Other Ambulatory Visit: Payer: Self-pay | Admitting: Physician Assistant

## 2016-06-04 DIAGNOSIS — R1011 Right upper quadrant pain: Secondary | ICD-10-CM

## 2016-06-05 ENCOUNTER — Ambulatory Visit
Admission: RE | Admit: 2016-06-05 | Discharge: 2016-06-05 | Disposition: A | Discharge status: Home / Self Care | Payer: BLUE CROSS/BLUE SHIELD | Source: Ambulatory Visit | Attending: Physician Assistant | Admitting: Physician Assistant

## 2016-06-05 DIAGNOSIS — R109 Unspecified abdominal pain: Secondary | ICD-10-CM | POA: Diagnosis present

## 2016-06-05 DIAGNOSIS — K8020 Calculus of gallbladder without cholecystitis without obstruction: Secondary | ICD-10-CM | POA: Diagnosis not present

## 2016-06-05 DIAGNOSIS — R1011 Right upper quadrant pain: Secondary | ICD-10-CM

## 2016-06-08 ENCOUNTER — Other Ambulatory Visit: Payer: Self-pay

## 2016-06-09 ENCOUNTER — Telehealth: Payer: Self-pay | Admitting: Surgery

## 2016-06-09 ENCOUNTER — Encounter: Payer: Self-pay | Admitting: Surgery

## 2016-06-09 ENCOUNTER — Ambulatory Visit (INDEPENDENT_AMBULATORY_CARE_PROVIDER_SITE_OTHER): Payer: BLUE CROSS/BLUE SHIELD | Admitting: Surgery

## 2016-06-09 VITALS — BP 123/75 | HR 86 | Temp 98.5°F | Ht 64.0 in | Wt 156.0 lb

## 2016-06-09 DIAGNOSIS — K8020 Calculus of gallbladder without cholecystitis without obstruction: Secondary | ICD-10-CM | POA: Diagnosis not present

## 2016-06-09 DIAGNOSIS — M797 Fibromyalgia: Secondary | ICD-10-CM | POA: Insufficient documentation

## 2016-06-09 DIAGNOSIS — G459 Transient cerebral ischemic attack, unspecified: Secondary | ICD-10-CM | POA: Insufficient documentation

## 2016-06-09 DIAGNOSIS — R112 Nausea with vomiting, unspecified: Secondary | ICD-10-CM | POA: Insufficient documentation

## 2016-06-09 DIAGNOSIS — R609 Edema, unspecified: Secondary | ICD-10-CM | POA: Insufficient documentation

## 2016-06-09 DIAGNOSIS — Z9889 Other specified postprocedural states: Secondary | ICD-10-CM

## 2016-06-09 NOTE — Progress Notes (Signed)
Subjective:     Patient ID: Charlotte Hernandez, female   DOB: 15-Dec-1965, 50 y.o.   MRN: [id number]  HPI  50 yr old with Complaints of epigastric to right upper quadrant pain that started on Sunday. Patient states that the pain came on after eating a steak. Patient states that the pain then would move around her side and through to her back and cause pain in her right shoulder blade. That she did have some nausea along with this and some feelings about taste in the back of her mouth and a bitterness however she did not have any vomiting. Patient states she also has been having increased bloating and increased belching as well. Patient states that her bowel movements have been fluctuating between constipation and diarrhea. Patient states that her appetite has not been the same since Sunday that the pain is coming and going but is becoming more constant. Patient states that at this time she is only drinking some liquids and soups and occasionally have some crackers. In her past medical history does state that she had a TIA which was treated with aspirin however looking further into the notes and speaking with the patient and they felt that it was due to her hypokalemia that caused some of her symptoms as opposed to a true TIA.    Past Medical History  Diagnosis Date  . TIA (transient ischemic attack) 2016  . PONV (postoperative nausea and vomiting)   . Kidney stones   . Fibromyalgia   . Seizures (HCC)     LAST SEIZURE 2012 OR 2013-POSSIBLE SEIZURE 10-2015 BUT UNSURE-ADMITTED TO [facility] X 2 DAYS-POSSIBLE SEIZURE VS TIA-F/U WITH DR Clelia Croft AT [facility] ON 11-08-15  . Facial numbness   . Peripheral edema    Past Surgical History  Procedure Laterality Date  . Knee arthroscopy Right 1996 and 2017  . Knee arthroscopy Left 2016  . Carpal tunnel release Left 2011    x2  . Hand surgery    . Knee arthroscopy with anterior cruciate ligament (acl) repair with hamstring graft Right 12/11/2015    Procedure:  KNEE ARTHROSCOPY WITH REVISION OF ANTERIOR CRUCIATE LIGAMENT (ACL) REPAIR ;  Surgeon: Juanell Fairly, MD;  Location: [facility] ORS;  Service: Orthopedics;  Laterality: Right;  . Appendectomy  1990  . Abdominal hysterectomy  2016    Total  . Tonsillectomy  1972   Family History  Problem Relation Age of Onset  . Breast cancer Mother 70  . Breast cancer Maternal Grandmother    Social History   Social History  . Marital Status: Married    Spouse Name: N/A  . Number of Children: N/A  . Years of Education: N/A   Social History Main Topics  . Smoking status: Former Smoker -- 1.00 packs/day for 8 years    Types: Cigarettes    Quit date: 11/29/2015  . Smokeless tobacco: Never Used  . Alcohol Use: Yes     Comment: OCC  . Drug Use: No  . Sexual Activity: Not Asked   Other Topics Concern  . None   Social History Narrative    Current outpatient prescriptions:  .  acetaminophen (TYLENOL) 325 MG tablet, Take 650 mg by mouth every 6 (six) hours as needed. Reported on 12/11/2015, Disp: , Rfl:  .  aspirin 325 MG tablet, Take 325 mg by mouth daily., Disp: , Rfl:  .  butalbital-acetaminophen-caffeine (FIORICET, ESGIC) 50-325-40 MG tablet, Take by mouth., Disp: , Rfl:  .  Cyanocobalamin (VITAMIN B-12 CR)  1000 MCG TBCR, Take 1,000 mcg by mouth daily., Disp: , Rfl:  .  diphenhydramine-acetaminophen (TYLENOL PM) 25-500 MG TABS tablet, Take 1 tablet by mouth at bedtime., Disp: , Rfl:  .  hydrochlorothiazide (HYDRODIURIL) 25 MG tablet, Take 25 mg by mouth daily., Disp: , Rfl:  .  HYDROcodone-acetaminophen (NORCO/VICODIN) 5-325 MG tablet, Take 1 tablet by mouth every 6 (six) hours as needed for moderate pain., Disp: , Rfl:  .  ibuprofen (ADVIL,MOTRIN) 200 MG tablet, Take 200 mg by mouth every 6 (six) hours as needed., Disp: , Rfl:  .  omeprazole (PRILOSEC) 40 MG capsule, Take 40 mg by mouth every morning. , Disp: , Rfl:  .  ondansetron (ZOFRAN-ODT) 4 MG disintegrating tablet, Take by mouth., Disp: ,  Rfl:  .  phentermine 37.5 MG capsule, Take by mouth., Disp: , Rfl:  .  promethazine (PHENERGAN) 25 MG tablet, Take 25 mg by mouth every 6 (six) hours as needed. For nausea., Disp: , Rfl:  .  topiramate (TOPAMAX) 100 MG tablet, Take 100 mg by mouth 2 (two) times daily., Disp: , Rfl:  .  Turmeric 500 MG TABS, Take 1 tablet by mouth daily at 12 noon., Disp: , Rfl:  .  varenicline (CHANTIX) 1 MG tablet, Take 1 mg by mouth 2 (two) times daily., Disp: , Rfl:  No Known Allergies    Review of Systems  Constitutional: Positive for activity change, appetite change and fatigue. Negative for fever, chills and unexpected weight change.  HENT: Negative for congestion and sore throat.   Respiratory: Negative for cough, shortness of breath, wheezing and stridor.   Cardiovascular: Negative for chest pain, palpitations and leg swelling.  Gastrointestinal: Positive for nausea, abdominal pain, diarrhea and abdominal distention. Negative for vomiting, blood in stool and anal bleeding.  Genitourinary: Negative for dysuria and hematuria.  Musculoskeletal: Negative for back pain and neck pain.  Skin: Negative for color change, pallor, rash and wound.  Neurological: Negative for dizziness and weakness.  Hematological: Negative for adenopathy. Does not bruise/bleed easily.  Psychiatric/Behavioral: Negative for agitation. The patient is not nervous/anxious.   All other systems reviewed and are negative.      Filed Vitals:   06/09/16 1017  BP: 123/75  Pulse: 86  Temp: 98.5 F (36.9 C)    Objective:   Physical Exam  Constitutional: She is oriented to person, place, and time. She appears well-developed and well-nourished. No distress.  HENT:  Head: Normocephalic and atraumatic.  Right Ear: External ear normal.  Left Ear: External ear normal.  Nose: Nose normal.  Mouth/Throat: Oropharynx is clear and moist. No oropharyngeal exudate.  Eyes: Conjunctivae and EOM are normal. Pupils are equal, round, and  reactive to light. No scleral icterus.  Neck: Normal range of motion. Neck supple. No tracheal deviation present.  Cardiovascular: Normal rate, regular rhythm and intact distal pulses.  Exam reveals no gallop and no friction rub.   No murmur heard. Pulmonary/Chest: Effort normal and breath sounds normal. No respiratory distress. She has no wheezes. She has no rales.  Abdominal: Soft. Bowel sounds are normal. She exhibits no distension and no mass. There is tenderness. There is guarding. There is no rebound.  Moderate tenderness in Epigastrium and RUQ with + Murphy's sign, well healed scars in Pfannenstiel incision and RLQ incision    Musculoskeletal: Normal range of motion. She exhibits no edema or tenderness.  Neurological: She is alert and oriented to person, place, and time. No cranial nerve deficit.  Skin: Skin is warm  and dry. No rash noted. No erythema. No pallor.  Psychiatric: She has a normal mood and affect. Her behavior is normal. Judgment and thought content normal.  Vitals reviewed.  Ultrasound:  IMPRESSION: Cholelithiasis.    Assessment:     50 yr old with symptomatic cholelithiasis     Plan:     Personally reviewed the patient's past medical history including her ED visit over the right upper quadrant pain. I personally reviewed her laboratory values which do not show any abnormalities in white blood cell count or liver enzymes. I have personally reviewed her ultrasound images which she says some stones in the gallbladder but a normal gallbladder wall and a normal common bile duct. Also reviewed the radiology read as above.  The risks, benefits, complications, treatment options, and expected outcomes were discussed with the patient. The possibilities of bleeding, recurrent infection, finding a normal gallbladder, perforation of viscus organs, damage to surrounding structures, bile leak, abscess formation, needing a drain placed, the need for additional procedures, reaction to  medication, pulmonary aspiration,  failure to diagnose a condition, the possible need to convert to an open procedure, and creating a complication requiring transfusion or operation were discussed with the patient. The patient and husband concurred with the proposed plan, giving informed consent. I discussed it with my partner Dr. Michela Pitcher who is currently on days and will be able to get her and tomorrow for a laparoscopic cholecystectomy. Patient has been agreeable with this plan.

## 2016-06-09 NOTE — Telephone Encounter (Signed)
Pt advised of pre op date/time and sx date. Sx: 06/10/16 with Dr Ely--Laparoscopic cholecystectomy with IOL/ Pre op: Patient advised to arrive at 7:00 am the day of surgery to register and discuss anesthesia.   Patient advised to have nothing to eat or drink after midnight and that she should take her Topamax in the morning with a sip of water. She was also advised to take all her medications with her in the morning to pre admit.   Patient verbalized understanding.

## 2016-06-09 NOTE — Patient Instructions (Signed)
You have requested to have your gallbladder removed. We will arrange for this to be done on 06/10/16 at [facility] with Dr. Michela Pitcher.  You will most likely be out of work 1-2 weeks for this surgery. You will return after your post-op appointment with a lifting restriction for approximately 4 more weeks.  You will be able to eat anything you would like to following surgery. But, start by eating a bland diet and advance this as tolerated.  Please see the (blue)pre-care form that you have been given today. If you have any questions, please call our office.  Laparoscopic Cholecystectomy Laparoscopic cholecystectomy is surgery to remove the gallbladder. The gallbladder is located in the upper right part of the abdomen, behind the liver. It is a storage sac for bile, which is produced in the liver. Bile aids in the digestion and absorption of fats. Cholecystectomy is often done for inflammation of the gallbladder (cholecystitis). This condition is usually caused by a buildup of gallstones (cholelithiasis) in the gallbladder. Gallstones can block the flow of bile, and that can result in inflammation and pain. In severe cases, emergency surgery may be required. If emergency surgery is not required, you will have time to prepare for the procedure. Laparoscopic surgery is an alternative to open surgery. Laparoscopic surgery has a shorter recovery time. Your common bile duct may also need to be examined during the procedure. If stones are found in the common bile duct, they may be removed. LET [facility] CARE PROVIDER KNOW ABOUT:  Any allergies you have.  All medicines you are taking, including vitamins, herbs, eye drops, creams, and over-the-counter medicines.  Previous problems you or members of your family have had with the use of anesthetics.  Any blood disorders you have.  Previous surgeries you have had.    Any medical conditions you have. RISKS AND COMPLICATIONS Generally, this is a safe  procedure. However, problems may occur, including:  Infection.  Bleeding.  Allergic reactions to medicines.  Damage to other structures or organs.  A stone remaining in the common bile duct.  A bile leak from the cyst duct that is clipped when your gallbladder is removed.  The need to convert to open surgery, which requires a larger incision in the abdomen. This may be necessary if your surgeon thinks that it is not safe to continue with a laparoscopic procedure. BEFORE THE PROCEDURE  Ask your health care provider about:  Changing or stopping your regular medicines. This is especially important if you are taking diabetes medicines or blood thinners.  Taking medicines such as aspirin and ibuprofen. These medicines can thin your blood. Do not take these medicines before your procedure if your health care provider instructs you not to.  Follow instructions from your health care provider about eating or drinking restrictions.  Let your health care provider know if you develop a cold or an infection before surgery.  Plan to have someone take you home after the procedure.  Ask your health care provider how your surgical site will be marked or identified.  You may be given antibiotic medicine to help prevent infection. PROCEDURE  To reduce your risk of infection:  Your health care team will wash or sanitize their hands.  Your skin will be washed with soap.  An IV tube may be inserted into one of your veins.  You will be given a medicine to make you fall asleep (general anesthetic).  A breathing tube will be placed in your mouth.  The surgeon  will make several small cuts (incisions) in your abdomen.  A thin, lighted tube (laparoscope) that has a tiny camera on the end will be inserted through one of the small incisions. The camera on the laparoscope will send a picture to a TV screen (monitor) in the operating room. This will give the surgeon a good view inside your  abdomen.  A gas will be pumped into your abdomen. This will expand your abdomen to give the surgeon more room to perform the surgery.  Other tools that are needed for the procedure will be inserted through the other incisions. The gallbladder will be removed through one of the incisions.  After your gallbladder has been removed, the incisions will be closed with stitches (sutures), staples, or skin glue.  Your incisions may be covered with a bandage (dressing). The procedure may vary among health care providers and hospitals. AFTER THE PROCEDURE  Your blood pressure, heart rate, breathing rate, and blood oxygen level will be monitored often until the medicines you were given have worn off.  You will be given medicines as needed to control your pain.   This information is not intended to replace advice given to you by your health care provider. Make sure you discuss any questions you have with your health care provider.   Document Released: 11/16/2005 Document Revised: 08/07/2015 Document Reviewed: 06/28/2013 Elsevier Interactive Patient Education 2016 [place] Diet for Pancreatitis or Gallbladder Conditions A low-fat diet can be helpful if you have pancreatitis or a gallbladder condition. With these conditions, your pancreas and gallbladder have trouble digesting fats. A healthy eating plan with less fat will help rest your pancreas and gallbladder and reduce your symptoms. WHAT DO I NEED TO KNOW ABOUT THIS DIET?  Eat a low-fat diet.  Reduce your fat intake to less than 20-30% of your total daily calories. This is less than 50-60 g of fat per day.  Remember that you need some fat in your diet. Ask your dietician what your daily goal should be.  Choose nonfat and low-fat healthy foods. Look for the words "nonfat," "low fat," or "fat free."  As a guide, look on the label and choose foods with less than 3 g of fat per serving. Eat only one serving.  Avoid  alcohol.  Do not smoke. If you need help quitting, talk with your health care provider.  Eat small frequent meals instead of three large heavy meals. WHAT FOODS CAN I EAT? Grains Include healthy grains and starches such as potatoes, wheat bread, fiber-rich cereal, and brown rice. Choose whole grain options whenever possible. In adults, whole grains should account for 45-65% of your daily calories.  Fruits and Vegetables Eat plenty of fruits and vegetables. Fresh fruits and vegetables add fiber to your diet. Meats and Other Protein Sources Eat lean meat such as chicken and pork. Trim any fat off of meat before cooking it. Eggs, fish, and beans are other sources of protein. In adults, these foods should account for 10-35% of your daily calories. Dairy Choose low-fat milk and dairy options. Dairy includes fat and protein, as well as calcium.  Fats and Oils Limit high-fat foods such as fried foods, sweets, baked goods, sugary drinks.  Other Creamy sauces and condiments, such as mayonnaise, can add extra fat. Think about whether or not you need to use them, or use smaller amounts or low fat options. WHAT FOODS ARE NOT RECOMMENDED?  High fat foods, such as:  [employer].  Ice  cream.  French toast.  Sweet rolls.  Pizza.  Cheese bread.  Foods covered with batter, butter, creamy sauces, or cheese.  Fried foods.  Sugary drinks and desserts.  Foods that cause gas or bloating   This information is not intended to replace advice given to you by your health care provider. Make sure you discuss any questions you have with your health care provider.   Document Released: 11/21/2013 Document Reviewed: 11/21/2013 Elsevier Interactive Patient Education [employer]2016 Elsevier Inc.

## 2016-06-10 ENCOUNTER — Ambulatory Visit: Payer: BLUE CROSS/BLUE SHIELD | Admitting: Certified Registered Nurse Anesthetist

## 2016-06-10 ENCOUNTER — Observation Stay
Admission: RE | Admit: 2016-06-10 | Discharge: 2016-06-11 | Disposition: A | Discharge status: Home / Self Care | Payer: BLUE CROSS/BLUE SHIELD | Source: Ambulatory Visit | Attending: Surgery | Admitting: Surgery

## 2016-06-10 ENCOUNTER — Encounter: Payer: Self-pay | Admitting: *Deleted

## 2016-06-10 ENCOUNTER — Ambulatory Visit: Payer: BLUE CROSS/BLUE SHIELD

## 2016-06-10 ENCOUNTER — Encounter
Admission: RE | Disposition: A | Discharge status: Home / Self Care | Payer: Self-pay | Source: Ambulatory Visit | Attending: Surgery

## 2016-06-10 DIAGNOSIS — Z87891 Personal history of nicotine dependence: Secondary | ICD-10-CM | POA: Insufficient documentation

## 2016-06-10 DIAGNOSIS — K8000 Calculus of gallbladder with acute cholecystitis without obstruction: Secondary | ICD-10-CM

## 2016-06-10 DIAGNOSIS — Z79899 Other long term (current) drug therapy: Secondary | ICD-10-CM | POA: Insufficient documentation

## 2016-06-10 DIAGNOSIS — M797 Fibromyalgia: Secondary | ICD-10-CM | POA: Insufficient documentation

## 2016-06-10 DIAGNOSIS — K8010 Calculus of gallbladder with chronic cholecystitis without obstruction: Secondary | ICD-10-CM | POA: Diagnosis present

## 2016-06-10 DIAGNOSIS — Z791 Long term (current) use of non-steroidal anti-inflammatories (NSAID): Secondary | ICD-10-CM | POA: Diagnosis not present

## 2016-06-10 DIAGNOSIS — R569 Unspecified convulsions: Secondary | ICD-10-CM | POA: Diagnosis not present

## 2016-06-10 DIAGNOSIS — K8020 Calculus of gallbladder without cholecystitis without obstruction: Secondary | ICD-10-CM | POA: Diagnosis present

## 2016-06-10 DIAGNOSIS — Z90710 Acquired absence of both cervix and uterus: Secondary | ICD-10-CM | POA: Diagnosis not present

## 2016-06-10 DIAGNOSIS — Z7982 Long term (current) use of aspirin: Secondary | ICD-10-CM | POA: Diagnosis not present

## 2016-06-10 DIAGNOSIS — Z87442 Personal history of urinary calculi: Secondary | ICD-10-CM | POA: Diagnosis not present

## 2016-06-10 DIAGNOSIS — Z9049 Acquired absence of other specified parts of digestive tract: Secondary | ICD-10-CM | POA: Insufficient documentation

## 2016-06-10 DIAGNOSIS — Z803 Family history of malignant neoplasm of breast: Secondary | ICD-10-CM | POA: Insufficient documentation

## 2016-06-10 DIAGNOSIS — Z419 Encounter for procedure for purposes other than remedying health state, unspecified: Secondary | ICD-10-CM

## 2016-06-10 DIAGNOSIS — G459 Transient cerebral ischemic attack, unspecified: Secondary | ICD-10-CM | POA: Diagnosis not present

## 2016-06-10 DIAGNOSIS — Z9889 Other specified postprocedural states: Secondary | ICD-10-CM | POA: Insufficient documentation

## 2016-06-10 HISTORY — PX: CHOLECYSTECTOMY: SHX55

## 2016-06-10 LAB — CBC
HCT: 36.2 % (ref 35.0–47.0)
HEMOGLOBIN: 12.4 g/dL (ref 12.0–16.0)
MCH: 32 pg (ref 26.0–34.0)
MCHC: 34.2 g/dL (ref 32.0–36.0)
MCV: 93.6 fL (ref 80.0–100.0)
Platelets: 161 10*3/uL (ref 150–440)
RBC: 3.87 MIL/uL (ref 3.80–5.20)
RDW: 14.4 % (ref 11.5–14.5)
WBC: 5.5 10*3/uL (ref 3.6–11.0)

## 2016-06-10 LAB — CREATININE, SERUM
CREATININE: 0.7 mg/dL (ref 0.44–1.00)
GFR calc Af Amer: >60 mL/min (ref >60)

## 2016-06-10 SURGERY — LAPAROSCOPIC CHOLECYSTECTOMY WITH INTRAOPERATIVE CHOLANGIOGRAM
Anesthesia: General

## 2016-06-10 MED ORDER — LIDOCAINE HCL (CARDIAC) 20 MG/ML IV SOLN
INTRAVENOUS | Status: DC | PRN
  Administered 2016-06-10: 80 mg via INTRAVENOUS

## 2016-06-10 MED ORDER — FENTANYL CITRATE (PF) 100 MCG/2ML IJ SOLN
INTRAMUSCULAR | Status: DC | PRN
  Administered 2016-06-10 (×3): 50 ug via INTRAVENOUS

## 2016-06-10 MED ORDER — PROMETHAZINE HCL 25 MG/ML IJ SOLN
INTRAMUSCULAR | Status: AC
  Administered 2016-06-10: 12.5 mg via INTRAVENOUS
  Filled 2016-06-10: qty 1

## 2016-06-10 MED ORDER — ASPIRIN 325 MG PO TABS
325.0000 mg | ORAL_TABLET | Freq: Every day | ORAL | Status: DC
  Administered 2016-06-10: 325 mg via ORAL
  Filled 2016-06-10: qty 1

## 2016-06-10 MED ORDER — PROMETHAZINE HCL 25 MG/ML IJ SOLN
INTRAMUSCULAR | Status: AC
  Administered 2016-06-10: 25 mg via INTRAVENOUS
  Filled 2016-06-10: qty 1

## 2016-06-10 MED ORDER — ACETAMINOPHEN 500 MG PO TABS
1000.0000 mg | ORAL_TABLET | ORAL | Status: AC
  Administered 2016-06-10: 1000 mg via ORAL

## 2016-06-10 MED ORDER — HYDROCODONE-ACETAMINOPHEN 5-325 MG PO TABS
1.0000 | ORAL_TABLET | ORAL | Status: DC | PRN
  Administered 2016-06-10: 2 via ORAL
  Administered 2016-06-10 (×2): 1 via ORAL
  Administered 2016-06-11: 2 via ORAL
  Filled 2016-06-10 (×2): qty 1
  Filled 2016-06-10 (×3): qty 2

## 2016-06-10 MED ORDER — HYDROCHLOROTHIAZIDE 25 MG PO TABS: 25.0000 mg | ORAL_TABLET | Freq: Every day | ORAL | Status: DC

## 2016-06-10 MED ORDER — BUPIVACAINE HCL (PF) 0.25 % IJ SOLN
INTRAMUSCULAR | Status: AC
  Filled 2016-06-10: qty 30

## 2016-06-10 MED ORDER — SCOPOLAMINE 1 MG/3DAYS TD PT72
1.0000 | MEDICATED_PATCH | Freq: Once | TRANSDERMAL | Status: DC
  Administered 2016-06-10: 1.5 mg via TRANSDERMAL

## 2016-06-10 MED ORDER — ENOXAPARIN SODIUM 40 MG/0.4ML ~~LOC~~ SOLN
40.0000 mg | SUBCUTANEOUS | Status: AC
  Administered 2016-06-10: 40 mg via SUBCUTANEOUS
  Filled 2016-06-10: qty 0.4

## 2016-06-10 MED ORDER — ROCURONIUM BROMIDE 100 MG/10ML IV SOLN
INTRAVENOUS | Status: DC | PRN
  Administered 2016-06-10: 5 mg via INTRAVENOUS
  Administered 2016-06-10: 20 mg via INTRAVENOUS
  Administered 2016-06-10: 35 mg via INTRAVENOUS
  Administered 2016-06-10: 5 mg via INTRAVENOUS

## 2016-06-10 MED ORDER — SCOPOLAMINE 1 MG/3DAYS TD PT72
MEDICATED_PATCH | TRANSDERMAL | Status: AC
  Administered 2016-06-10: 1.5 mg via TRANSDERMAL
  Filled 2016-06-10: qty 1

## 2016-06-10 MED ORDER — MIDAZOLAM HCL 2 MG/2ML IJ SOLN
INTRAMUSCULAR | Status: DC | PRN
  Administered 2016-06-10: 2 mg via INTRAVENOUS

## 2016-06-10 MED ORDER — SUCCINYLCHOLINE CHLORIDE 20 MG/ML IJ SOLN
INTRAMUSCULAR | Status: DC | PRN
  Administered 2016-06-10: 100 mg via INTRAVENOUS

## 2016-06-10 MED ORDER — VARENICLINE TARTRATE 1 MG PO TABS
1.0000 mg | ORAL_TABLET | Freq: Two times a day (BID) | ORAL | Status: DC
  Administered 2016-06-10: 1 mg via ORAL
  Filled 2016-06-10 (×3): qty 1

## 2016-06-10 MED ORDER — SODIUM CHLORIDE 0.9 % IJ SOLN
INTRAMUSCULAR | Status: AC
  Filled 2016-06-10: qty 10

## 2016-06-10 MED ORDER — KCL IN DEXTROSE-NACL 20-5-0.45 MEQ/L-%-% IV SOLN
INTRAVENOUS | Status: DC
  Administered 2016-06-10 – 2016-06-11 (×2): via INTRAVENOUS
  Filled 2016-06-10 (×3): qty 1000

## 2016-06-10 MED ORDER — DEXAMETHASONE SODIUM PHOSPHATE 10 MG/ML IJ SOLN
INTRAMUSCULAR | Status: DC | PRN
  Administered 2016-06-10: 10 mg via INTRAVENOUS

## 2016-06-10 MED ORDER — BUPIVACAINE HCL (PF) 0.25 % IJ SOLN
INTRAMUSCULAR | Status: DC | PRN
  Administered 2016-06-10: 30 mL

## 2016-06-10 MED ORDER — MORPHINE SULFATE (PF) 4 MG/ML IV SOLN
4.0000 mg | INTRAVENOUS | Status: DC | PRN
  Administered 2016-06-11: 4 mg via INTRAVENOUS
  Filled 2016-06-10: qty 1

## 2016-06-10 MED ORDER — ACETAMINOPHEN 10 MG/ML IV SOLN
INTRAVENOUS | Status: AC
  Filled 2016-06-10: qty 100

## 2016-06-10 MED ORDER — ONDANSETRON HCL 4 MG/2ML IJ SOLN
INTRAMUSCULAR | Status: DC | PRN
  Administered 2016-06-10 (×2): 4 mg via INTRAVENOUS

## 2016-06-10 MED ORDER — KETOROLAC TROMETHAMINE 30 MG/ML IJ SOLN
INTRAMUSCULAR | Status: DC | PRN
  Administered 2016-06-10: 30 mg via INTRAVENOUS

## 2016-06-10 MED ORDER — FENTANYL CITRATE (PF) 100 MCG/2ML IJ SOLN
INTRAMUSCULAR | Status: AC
  Administered 2016-06-10: 25 ug via INTRAVENOUS
  Filled 2016-06-10: qty 2

## 2016-06-10 MED ORDER — PROMETHAZINE HCL 25 MG/ML IJ SOLN
6.2500 mg | INTRAMUSCULAR | Status: AC | PRN
  Administered 2016-06-10 (×2): 12.5 mg via INTRAVENOUS

## 2016-06-10 MED ORDER — FAMOTIDINE 20 MG PO TABS
20.0000 mg | ORAL_TABLET | Freq: Once | ORAL | Status: AC
  Administered 2016-06-10: 20 mg via ORAL

## 2016-06-10 MED ORDER — LACTATED RINGERS IV SOLN
INTRAVENOUS | Status: DC | PRN
  Administered 2016-06-10 (×2): via INTRAVENOUS

## 2016-06-10 MED ORDER — KETAMINE HCL 50 MG/ML IJ SOLN
INTRAMUSCULAR | Status: DC | PRN
  Administered 2016-06-10: 30 mg via INTRAMUSCULAR

## 2016-06-10 MED ORDER — DEXTROSE 5 % IV SOLN
2.0000 g | INTRAVENOUS | Status: AC
  Administered 2016-06-10: 2 g via INTRAVENOUS
  Filled 2016-06-10: qty 2

## 2016-06-10 MED ORDER — CHLORHEXIDINE GLUCONATE CLOTH 2 % EX PADS: 6.0000 | MEDICATED_PAD | Freq: Once | CUTANEOUS | Status: DC

## 2016-06-10 MED ORDER — IOTHALAMATE MEGLUMINE 60 % INJ SOLN
INTRAMUSCULAR | Status: DC | PRN
  Administered 2016-06-10: 5 mL

## 2016-06-10 MED ORDER — TOPIRAMATE 100 MG PO TABS
100.0000 mg | ORAL_TABLET | Freq: Two times a day (BID) | ORAL | Status: DC
  Administered 2016-06-10 (×2): 100 mg via ORAL
  Filled 2016-06-10 (×3): qty 1

## 2016-06-10 MED ORDER — ONDANSETRON 4 MG PO TBDP
4.0000 mg | ORAL_TABLET | Freq: Four times a day (QID) | ORAL | Status: DC | PRN
  Filled 2016-06-10: qty 1

## 2016-06-10 MED ORDER — PHENTERMINE HCL 37.5 MG PO CAPS: 37.5000 mg | ORAL_CAPSULE | Freq: Every day | ORAL | Status: DC

## 2016-06-10 MED ORDER — ENOXAPARIN SODIUM 40 MG/0.4ML ~~LOC~~ SOLN: 40.0000 mg | SUBCUTANEOUS | Status: DC

## 2016-06-10 MED ORDER — ACETAMINOPHEN 500 MG PO TABS
ORAL_TABLET | ORAL | Status: AC
  Administered 2016-06-10: 1000 mg via ORAL
  Filled 2016-06-10: qty 2

## 2016-06-10 MED ORDER — ONDANSETRON HCL 4 MG/2ML IJ SOLN: 4.0000 mg | Freq: Four times a day (QID) | INTRAMUSCULAR | Status: DC | PRN

## 2016-06-10 MED ORDER — FAMOTIDINE IN NACL 20-0.9 MG/50ML-% IV SOLN
20.0000 mg | Freq: Two times a day (BID) | INTRAVENOUS | Status: DC
Start: 2016-06-10 — End: 2016-06-11
  Administered 2016-06-10 (×2): 20 mg via INTRAVENOUS
  Filled 2016-06-10 (×4): qty 50

## 2016-06-10 MED ORDER — ACETAMINOPHEN 10 MG/ML IV SOLN
INTRAVENOUS | Status: DC | PRN
  Administered 2016-06-10: 1000 mg via INTRAVENOUS

## 2016-06-10 MED ORDER — HYDROCODONE-ACETAMINOPHEN 5-325 MG PO TABS: 2.0000 | ORAL_TABLET | Freq: Four times a day (QID) | ORAL | Status: AC | PRN

## 2016-06-10 MED ORDER — FAMOTIDINE 20 MG PO TABS
ORAL_TABLET | ORAL | Status: AC
  Administered 2016-06-10: 20 mg via ORAL
  Filled 2016-06-10: qty 1

## 2016-06-10 MED ORDER — SUGAMMADEX SODIUM 200 MG/2ML IV SOLN
INTRAVENOUS | Status: DC | PRN
Start: 2016-06-10 — End: 2016-06-10
  Administered 2016-06-10: 141.6 mg via INTRAVENOUS

## 2016-06-10 MED ORDER — PROPOFOL 10 MG/ML IV BOLUS
INTRAVENOUS | Status: DC | PRN
  Administered 2016-06-10: 160 mg via INTRAVENOUS

## 2016-06-10 MED ORDER — FENTANYL CITRATE (PF) 100 MCG/2ML IJ SOLN
25.0000 ug | INTRAMUSCULAR | Status: DC | PRN
  Administered 2016-06-10 (×4): 25 ug via INTRAVENOUS

## 2016-06-10 SURGICAL SUPPLY — 40 items
APPLIER CLIP ROT 10 11.4 M/L (STAPLE) ×3
BAG COUNTER SPONGE EZ (MISCELLANEOUS) ×2 IMPLANT
CANISTER SUCT 1200ML W/VALVE (MISCELLANEOUS) ×3 IMPLANT
CATH REDDICK CHOLANGI 4FR 50CM (CATHETERS) ×3 IMPLANT
CHLORAPREP W/TINT 26ML (MISCELLANEOUS) ×3 IMPLANT
CLIP APPLIE ROT 10 11.4 M/L (STAPLE) ×1 IMPLANT
CONRAY 60ML FOR OR (MISCELLANEOUS) ×3 IMPLANT
COUNTER SPONGE BAG EZ (MISCELLANEOUS) ×1
DRAPE SHEET LG 3/4 BI-LAMINATE (DRAPES) ×3 IMPLANT
DRSG TEGADERM 2-3/8X2-3/4 SM (GAUZE/BANDAGES/DRESSINGS) ×12 IMPLANT
DRSG TELFA 3X8 NADH (GAUZE/BANDAGES/DRESSINGS) ×3 IMPLANT
ELECT REM PT RETURN 9FT ADLT (ELECTROSURGICAL) ×3
ELECTRODE REM PT RTRN 9FT ADLT (ELECTROSURGICAL) ×1 IMPLANT
GLOVE BIO SURGEON STRL SZ7.5 (GLOVE) ×3 IMPLANT
GLOVE INDICATOR 8.0 STRL GRN (GLOVE) ×3 IMPLANT
GOWN STRL REUS W/ TWL LRG LVL3 (GOWN DISPOSABLE) ×2 IMPLANT
GOWN STRL REUS W/TWL LRG LVL3 (GOWN DISPOSABLE) ×4
GRASPER SUT TROCAR 14GX15 (MISCELLANEOUS) ×3 IMPLANT
IRRIGATION STRYKERFLOW (MISCELLANEOUS) ×1 IMPLANT
IRRIGATOR STRYKERFLOW (MISCELLANEOUS) ×3
IV NS 1000ML (IV SOLUTION) ×2
IV NS 1000ML BAXH (IV SOLUTION) ×1 IMPLANT
LABEL OR SOLS (LABEL) ×3 IMPLANT
NDL SAFETY 18GX1.5 (NEEDLE) ×3 IMPLANT
NEEDLE HYPO 25X1 1.5 SAFETY (NEEDLE) ×3 IMPLANT
NEEDLE INSUFFLATION 14GA 120MM (NEEDLE) ×3 IMPLANT
NS IRRIG 500ML POUR BTL (IV SOLUTION) ×3 IMPLANT
PACK LAP CHOLECYSTECTOMY (MISCELLANEOUS) ×3 IMPLANT
POUCH ENDO CATCH 10MM SPEC (MISCELLANEOUS) ×3 IMPLANT
SCISSORS METZENBAUM CVD 33 (INSTRUMENTS) ×3 IMPLANT
SEAL FOR SCOPE WARMER C3101 (MISCELLANEOUS) ×3 IMPLANT
SLEEVE ADV FIXATION 5X100MM (TROCAR) ×3 IMPLANT
SUT ETHILON 5-0 FS-2 18 BLK (SUTURE) ×3 IMPLANT
SUT VIC AB 0 CT2 27 (SUTURE) ×3 IMPLANT
SYR 3ML LL SCALE MARK (SYRINGE) ×3 IMPLANT
TROCAR Z-THREAD FIOS 11X100 BL (TROCAR) ×3 IMPLANT
TROCAR Z-THREAD OPTICAL 5X100M (TROCAR) ×3 IMPLANT
TROCAR Z-THREAD SLEEVE 11X100 (TROCAR) ×3 IMPLANT
TUBING INSUFFLATOR HI FLOW (MISCELLANEOUS) ×3 IMPLANT
WATER STERILE IRR 1000ML POUR (IV SOLUTION) ×3 IMPLANT

## 2016-06-10 NOTE — Anesthesia Procedure Notes (Addendum)
Procedure Name: Intubation Performed by: Malva CoganBEANE, Adarrius Graeff Pre-anesthesia Checklist: Patient identified, Patient being monitored, Timeout performed, Emergency Drugs available and Suction available Patient Re-evaluated:Patient Re-evaluated prior to inductionOxygen Delivery Method: Circle system utilized Preoxygenation: Pre-oxygenation with 100% oxygen Intubation Type: IV induction Ventilation: Mask ventilation without difficulty Laryngoscope Size: Mac and 3 Grade View: Grade I Tube type: Oral Tube size: 7.0 mm Number of attempts: 1 Airway Equipment and Method: Stylet Placement Confirmation: ETT inserted through vocal cords under direct vision,  positive ETCO2 and breath sounds checked- equal and bilateral Secured at: 21 cm Tube secured with: Tape Dental Injury: Teeth and Oropharynx as per pre-operative assessment  Comments: Pt with chip to right front tooth prior to induction.

## 2016-06-10 NOTE — Progress Notes (Signed)

## 2016-06-10 NOTE — Anesthesia Preprocedure Evaluation (Signed)
Anesthesia Evaluation  Patient identified by MRN, date of birth, ID band Patient awake    Reviewed: Allergy & Precautions, H&P , NPO status , Patient's Chart, lab work & pertinent test results, reviewed documented beta blocker date and time   History of Anesthesia Complications (+) PONV and history of anesthetic complications  Airway Mallampati: III  TM Distance: >3 FB Neck ROM: full    Dental no notable dental hx. (+) Teeth Intact   Pulmonary neg pulmonary ROS, former smoker,    Pulmonary exam normal breath sounds clear to auscultation       Cardiovascular Exercise Tolerance: Good negative cardio ROS Normal cardiovascular exam Rhythm:regular Rate:Normal     Neuro/Psych Seizures -, Well Controlled,  TIA Neuromuscular disease (fibromyalgia) negative psych ROS   GI/Hepatic Neg liver ROS, GERD  ,  Endo/Other  negative endocrine ROS  Renal/GU Renal disease (kidney stones)  negative genitourinary   Musculoskeletal   Abdominal   Peds  Hematology negative hematology ROS (+)   Anesthesia Other Findings Past Medical History:   TIA (transient ischemic attack)                 2016         Facial numbness                                              Peripheral edema                                             PONV (postoperative nausea and vomiting)                     Kidney stones                                                Seizures (HCC)                                                 Comment:LAST SEIZURE 2012 OR 2013-POSSIBLE SEIZURE               10-2015 BUT UNSURE-ADMITTED TO [facility] X 2               DAYS-POSSIBLE SEIZURE VS TIA-F/U [facility] DR [facility]   Fibromyalgia                                                 Reproductive/Obstetrics negative OB ROS                             Anesthesia Physical Anesthesia Plan  ASA: II  Anesthesia Plan:  General   Post-op Pain Management:    Induction:   Airway  Management Planned:   Additional Equipment:   Intra-op Plan:   Post-operative Plan:   Informed Consent: I have reviewed the patients History and Physical, chart, labs and discussed the procedure including the risks, benefits and alternatives for the proposed anesthesia with the patient or authorized representative who has indicated his/her understanding and acceptance.   Dental Advisory Given  Plan Discussed with: Anesthesiologist, CRNA and Surgeon  Anesthesia Plan Comments:         Anesthesia Quick Evaluation

## 2016-06-10 NOTE — Anesthesia Postprocedure Evaluation (Signed)
Anesthesia Post Note  Patient: Charlotte Hernandez  Procedure(s) Performed: Procedure(s) (LRB): LAPAROSCOPIC CHOLECYSTECTOMY WITH INTRAOPERATIVE CHOLANGIOGRAM (N/A)  Patient location during evaluation: PACU Anesthesia Type: General Level of consciousness: awake and alert Pain management: pain level controlled Vital Signs Assessment: post-procedure vital signs reviewed and stable Respiratory status: spontaneous breathing, nonlabored ventilation, respiratory function stable and patient connected to nasal cannula oxygen Cardiovascular status: blood pressure returned to baseline and stable Anesthetic complications: no    Last Vitals:  Filed Vitals:   06/10/16 1254 06/10/16 1415  BP:  103/53  Pulse: 76 70  Temp:  36.3 C  Resp: 12     Last Pain:  Filed Vitals:   06/10/16 1416  PainSc: 3                  Lenard SimmerAndrew Jasilyn Holderman

## 2016-06-10 NOTE — Progress Notes (Signed)
She continues to be persistently nauseated after surgery with frank vomiting. The enzymatic medications have made her somnolent and moderately hypotensive. We will go on admit her to the hospital put her back on IV fluids try to control her symptoms under observation. This plan has been discussed with the patient and her husband.

## 2016-06-10 NOTE — Transfer of Care (Signed)
Immediate Anesthesia Transfer of Care Note  Patient: Charlotte CriglerRobin H Bohan  Procedure(s) Performed: Procedure(s): LAPAROSCOPIC CHOLECYSTECTOMY WITH INTRAOPERATIVE CHOLANGIOGRAM (N/A)  Patient Location: PACU  Anesthesia Type:General  Level of Consciousness: sedated  Airway & Oxygen Therapy: Patient Spontanous Breathing and Patient connected to face mask oxygen  Post-op Assessment: Report given to RN and Post -op Vital signs reviewed and stable  Post vital signs: stable  Last Vitals:  Filed Vitals:   06/10/16 0751  BP: 90/57  Pulse: 74  Temp: 36.6 C  Resp: 18    Last Pain:  Filed Vitals:   06/10/16 1040  PainSc: 0-No pain         Complications: No apparent anesthesia complications

## 2016-06-10 NOTE — Op Note (Signed)
06/10/2016  10:37 AM  PATIENT:  Charlotte Hernandez  50 y.o. female  PRE-OPERATIVE DIAGNOSIS:  Symptomatic cholelithiasis  POST-OPERATIVE DIAGNOSIS:  Symptomatic cholelithiasis  PROCEDURE:  Procedure(s): LAPAROSCOPIC CHOLECYSTECTOMY WITH INTRAOPERATIVE CHOLANGIOGRAM (N/A)  SURGEON:  Surgeon(s) and Role:    * Salley Hewsalph Ely III, MD - Primary   ASSISTANTS: sampson, PA student   ANESTHESIA:   general  EBL:  Total I/O In: 1000 [I.V.:1000] Out: 10 [Blood:10]   DRAINS: none   LOCAL MEDICATIONS USED:  MARCAINE      DISPOSITION OF SPECIMEN:  PATHOLOGY   DICTATION: .Dragon Dictation With the patient in supine position and after induction of appropriate general anesthesia the patient's abdomen was prepped ChloraPrep and draped sterile towels. The patient was placed headdown feet up position. Small infraumbilical incision was made in the standard fashion carried down bluntly through the subcutaneous tissue. A varies needle was used cannulate peritoneal cavity. CO2 was insufflated to appropriate pressure measurements.  When approximately 2 L of CO2 were instilled a varies needle was withdrawn and an 11 mm port placed into the abdominal cavity. Intraperitoneal position was confirmed and CO2 was reinsufflated. The patient was placed a head up feet down position rotated slightly to the left side. Subxiphoid transverse incision was made 11 mm port inserted under direct vision. 2 lateral ports 5 mm in size were inserted under direct vision. The gallbladder was identified and visual inspection of the abdomen carried out. Within a few adhesions in the left lower quadrant no other obvious abnormalities were identified.  The gallbladder was retracted superiorly and laterally exposing the hepatoduodenal ligament. Cystic artery and cystic duct were identified. Cystic duct clipped on the gallbladder side and opened. Cholangiogram was performed using dynamic fluoroscopy. Fluoroscopy The duodenum was identified  but protected appear to be slightly dilated. Intrahepatic radicals were visualized. The catheter was withdrawn and the cystic duct doubly clipped on the common duct side and divided. Cystic artery was doubly clipped and divided.  The gallbladder was then dissected free from its bed in the liver using the cautery apparatus. There was a fold in the dome of the gallbladder which required dissection into the parenchyma slightly. Hemostasis appeared to be satisfactory. Once the gallbladder was free was captured in an Endo Catch apparatus and removed through the subxiphoid incision.  The area was copiously irrigated with warm saline solution. The abdomen was then desufflated. All ports withdrawn without difficulty. Skin incisions closed with 5-0 nylon. The umbilical fascial incision was closed with 0 Vicryl. There was infiltrated with 0.25% Marcaine for postoperative pain control. Sterile dressings were applied. The patient was then returned to the recovery room having tolerated the procedure well. Sponge instrument and needle count were correct 2 in the operating room. PLAN OF CARE: Discharge to home after PACU  PATIENT DISPOSITION:  PACU - hemodynamically stable.   Tiney Rougealph Ely III, MD

## 2016-06-11 ENCOUNTER — Encounter: Payer: Self-pay | Admitting: Surgery

## 2016-06-11 DIAGNOSIS — K8010 Calculus of gallbladder with chronic cholecystitis without obstruction: Secondary | ICD-10-CM | POA: Diagnosis not present

## 2016-06-11 LAB — CBC
HCT: 32.2 % — ABNORMAL LOW (ref 35.0–47.0)
Hemoglobin: 11.1 g/dL — ABNORMAL LOW (ref 12.0–16.0)
MCH: 31.6 pg (ref 26.0–34.0)
MCHC: 34.4 g/dL (ref 32.0–36.0)
MCV: 92 fL (ref 80.0–100.0)
PLATELETS: 169 10*3/uL (ref 150–440)
RBC: 3.5 MIL/uL — ABNORMAL LOW (ref 3.80–5.20)
RDW: 14.5 % (ref 11.5–14.5)
WBC: 5.3 10*3/uL (ref 3.6–11.0)

## 2016-06-11 LAB — BASIC METABOLIC PANEL
Anion gap: 9 (ref 5–15)
BUN: 13 mg/dL (ref 6–20)
CALCIUM: 8.4 mg/dL — AB (ref 8.9–10.3)
CHLORIDE: 110 mmol/L (ref 101–111)
CO2: 20 mmol/L — ABNORMAL LOW (ref 22–32)
CREATININE: 0.71 mg/dL (ref 0.44–1.00)
GFR calc Af Amer: >60 mL/min (ref >60)
GFR calc non Af Amer: >60 mL/min (ref >60)
Glucose, Bld: 113 mg/dL — ABNORMAL HIGH (ref 65–99)
Potassium: 3 mmol/L — ABNORMAL LOW (ref 3.5–5.1)
SODIUM: 139 mmol/L (ref 135–145)

## 2016-06-11 LAB — SURGICAL PATHOLOGY

## 2016-06-11 MED ORDER — HYDROCODONE-ACETAMINOPHEN 5-325 MG PO TABS: 1.0000 | ORAL_TABLET | ORAL | Status: DC | PRN

## 2016-06-11 NOTE — Progress Notes (Signed)
Patient discharge teaching given, including activity, diet, follow-up appoints, and medications. Patient verbalized understanding of all discharge instructions. IV access was d/c'd. Vitals are stable. Skin is intact except as charted in most recent assessments. Pt walked out with husband, to be driven home by family.  Karsten RoLauren E Hobbs

## 2016-06-11 NOTE — Discharge Instructions (Signed)
Laparoscopic Cholecystectomy, Care After   These instructions give you information on caring for yourself after your procedure. Your doctor may also give you more specific instructions. Call your doctor if you have any problems or questions after your procedure.  HOME CARE  Change your bandages (dressings) as told by your doctor.  Keep the wound dry and clean. Wash the wound gently with soap and water. Pat the wound dry with a clean towel.  Do not take baths, swim, or use hot tubs for 2 weeks, or as told by your doctor.  Only take medicine as told by your doctor.  Eat a normal diet as told by your doctor.  Do not lift anything heavier than 10 pounds (4.5 kg) until your doctor says it is okay.  Do not play contact sports for 1 week, or as told by your doctor. GET HELP IF:  Your wound is red, puffy (swollen), or painful.  You have yellowish-white fluid (pus) coming from the wound.  You have fluid draining from the wound for more than 1 day.  You have a bad smell coming from the wound.  Your wound breaks open. GET HELP RIGHT AWAY IF:  You have trouble breathing.  You have chest pain.  You have a fever >101  You have pain in the shoulders (shoulder strap areas) that is getting worse.  You feel dizzy or pass out (faint).  You have severe belly (abdominal) pain.  You feel sick to your stomach (nauseous) or throw up (vomit) for more than 1 day. AMBULATORY SURGERY  DISCHARGE INSTRUCTIONS   The drugs that you were given will stay in your system until tomorrow so for the next 24 hours you should not:  Drive an automobile Make any legal decisions Drink any alcoholic beverage   You may resume regular meals tomorrow.  Today it is better to start with liquids and gradually work up to solid foods.  You may eat anything you prefer, but it is better to start with liquids, then soup and crackers, and gradually work up to solid foods.   Please notify your doctor immediately if you have any  unusual bleeding, trouble breathing, redness and pain at the surgery site, drainage, fever, or pain not relieved by medication.    Additional Instructions:        Please contact your physician with any problems or Same Day Surgery at [phone], Monday through Friday 6 am to 4 pm, or [place] at [facility] number at [phone].Laparoscopic Cholecystectomy, Care After   These instructions give you information on caring for yourself after your procedure. Your doctor may also give you more specific instructions. Call your doctor if you have any problems or questions after your procedure.  HOME CARE  Change your bandages (dressings) as told by your doctor.  Keep the wound dry and clean. Wash the wound gently with soap and water. Pat the wound dry with a clean towel.  Do not take baths, swim, or use hot tubs for 2 weeks, or as told by your doctor.  Only take medicine as told by your doctor.  Eat a normal diet as told by your doctor.  Do not lift anything heavier than 10 pounds (4.5 kg) until your doctor says it is okay.  Do not play contact sports for 1 week, or as told by your doctor. GET HELP IF:  Your wound is red, puffy (swollen), or painful.  You have yellowish-white fluid (pus) coming from the wound.  You have fluid  draining from the wound for more than 1 day.  You have a bad smell coming from the wound.  Your wound breaks open. GET HELP RIGHT AWAY IF:  You have trouble breathing.  You have chest pain.  You have a fever >101  You have pain in the shoulders (shoulder strap areas) that is getting worse.  You feel dizzy or pass out (faint).  You have severe belly (abdominal) pain.  You feel sick to your stomach (nauseous) or throw up (vomit) for more than 1 day.

## 2016-06-11 NOTE — Discharge Summary (Signed)
Patient ID: Charlotte Hernandez MRN: [id number] DOB/AGE: [birth year] 50 y.o.  Admit date: 06/10/2016 Discharge date: 06/11/2016  Discharge Diagnoses:  Cholecystitis and cholelithiasis  Procedures Performed: Laparoscopic cholecystectomy with cholangiography  Discharged Condition: good  Hospital Course: After appropriate preoperative preparation informed consent the patient was taken to surgery on the morning of 06/10/2016. General 1 laparoscopic cholecystectomy with cholangiography. The procedure was uncomplicated. She did have a distended mildly inflamed gallbladder with no evidence of any biliary obstruction. Postoperatively she had significant nausea and vomited multiple times. Because of her intractable vomiting we admitted her to the hospital for IV antibiotics and IV replacement. This morning she tolerating liquid diet with no particular complaints. Her wounds look good. Her vital signs are stable. We'll discharge her home today to be following the office in 7-10 days time. Baby activity Drive instructions were discussed with the patient in detail.  Discharge Orders: Discharge Instructions    Diet - low sodium heart healthy    Complete by:  As directed      Diet - low sodium heart healthy    Complete by:  As directed      Increase activity slowly    Complete by:  As directed      Increase activity slowly    Complete by:  As directed      Remove dressing in 24 hours    Complete by:  As directed      Remove dressing in 48 hours    Complete by:  As directed            Disposition: 01-Home or Self Care  Discharge Medications:  Current facility-administered medications:  .  aspirin tablet 325 mg, 325 mg, Oral, Daily, Tiney Rouge III, MD, 325 mg at 06/10/16 1524 .  dextrose 5 % and 0.45 % NaCl with KCl 20 mEq/L infusion, , Intravenous, Continuous, Tiney Rouge III, MD, Last Rate: 75 mL/hr at 06/11/16 0518 .  enoxaparin (LOVENOX) injection 40 mg, 40 mg, Subcutaneous, Q24H, Tiney Rouge III,  MD .  famotidine (PEPCID) IVPB 20 mg premix, 20 mg, Intravenous, Q12H, Tiney Rouge III, MD, 20 mg at 06/10/16 2107 .  hydrochlorothiazide (HYDRODIURIL) tablet 25 mg, 25 mg, Oral, Daily, Tiney Rouge III, MD, 25 mg at 06/10/16 1447 .  HYDROcodone-acetaminophen (NORCO/VICODIN) 5-325 MG per tablet 1-2 tablet, 1-2 tablet, Oral, Q4H PRN, Tiney Rouge III, MD, 2 tablet at 06/11/16 0302 .  morphine 4 MG/ML injection 4 mg, 4 mg, Intravenous, Q2H PRN, Tiney Rouge III, MD, 4 mg at 06/11/16 0703 .  ondansetron (ZOFRAN-ODT) disintegrating tablet 4 mg, 4 mg, Oral, Q6H PRN **OR** ondansetron (ZOFRAN) injection 4 mg, 4 mg, Intravenous, Q6H PRN, Tiney Rouge III, MD .  scopolamine (TRANSDERM-SCOP) 1 MG/3DAYS 1.5 mg, 1 patch, Transdermal, Once, Lenard Simmer, MD, 1.5 mg at 06/10/16 0852 .  topiramate (TOPAMAX) tablet 100 mg, 100 mg, Oral, BID, Tiney Rouge III, MD, 100 mg at 06/10/16 2107 .  varenicline (CHANTIX) tablet 1 mg, 1 mg, Oral, BID, Tiney Rouge III, MD, 1 mg at 06/10/16 1642  Follwup: Follow-up Information    Follow up with [facility] In 1 week.   Why:  For suture removal, For wound re-check-Dr Tonita Cong 7/19 at [address]15[address] [place] Office   Contact information:   [address] [place] [address] [phone]      Follow up with [facility].   Why:  For suture removal, For wound re-check   Contact information:   [address] [place] [id number] [phone]      Signed: Tiney RougeRalph Ely III 06/11/2016, 8:34 AM

## 2016-06-17 ENCOUNTER — Ambulatory Visit (INDEPENDENT_AMBULATORY_CARE_PROVIDER_SITE_OTHER): Payer: BLUE CROSS/BLUE SHIELD | Admitting: General Surgery

## 2016-06-17 ENCOUNTER — Encounter: Payer: Self-pay | Admitting: General Surgery

## 2016-06-17 VITALS — BP 107/77 | HR 87 | Temp 97.2°F | Ht 64.0 in | Wt 157.0 lb

## 2016-06-17 DIAGNOSIS — Z4889 Encounter for other specified surgical aftercare: Secondary | ICD-10-CM

## 2016-06-17 NOTE — Progress Notes (Signed)
Outpatient Surgical Follow Up  06/17/2016  Charlotte Hernandez is an 50 y.o. female.   Chief Complaint  Patient presents with  . Routine Post Op    Laparoscopic Cholecystectomy (06/10/16)- Dr. Michela PitcherEly    HPI: 50 year old female returns to clinic 1 week s/p laparoscopic cholecystectomy. She reports feeling much better than before surgery. She is having some loose stools since surgery but no other complaints. She denies any pain, eating well and feeling much better. She denies any nausea, vomiting, chest pain, shortness of breath and has been very happy with her surgery experience.  Past Medical History  Diagnosis Date  . TIA (transient ischemic attack) 2016  . Facial numbness   . Peripheral edema   . PONV (postoperative nausea and vomiting)   . Kidney stones   . Seizures (HCC)     LAST SEIZURE 2012 OR 2013-POSSIBLE SEIZURE 10-2015 BUT UNSURE-ADMITTED TO [facility] X 2 DAYS-POSSIBLE SEIZURE VS TIA-F/U [facility]W [facility] ON 11-08-15  . Fibromyalgia     Past Surgical History  Procedure Laterality Date  . Knee arthroscopy Right 1996 and 2017  . Knee arthroscopy Left 2016  . Carpal tunnel release Left 2011    x2  . Hand surgery    . Knee arthroscopy with anterior cruciate ligament (acl) repair with hamstring graft Right 12/11/2015    Procedure: KNEE ARTHROSCOPY WITH REVISION OF ANTERIOR CRUCIATE LIGAMENT (ACL) REPAIR ;  Surgeon: Juanell FairlyKevin Krasinski, MD;  Location: [facility] ORS;  Service: Orthopedics;  Laterality: Right;  . Appendectomy  1990  . Abdominal hysterectomy  2016    Total  . Tonsillectomy  1972  . Cholecystectomy N/A 06/10/2016    Procedure: LAPAROSCOPIC CHOLECYSTECTOMY WITH INTRAOPERATIVE CHOLANGIOGRAM;  Surgeon: Tiney Rougealph Ely III, MD;  Location: [facility] ORS;  Service: General;  Laterality: N/A;    Family History  Problem Relation Age of Onset  . Breast cancer Mother [id number]  . Breast cancer Maternal Grandmother     Social History:  reports that she quit smoking about 6 months ago. Her  smoking use included Cigarettes. She has a 8 pack-year smoking history. She has never used smokeless tobacco. She reports that she drinks alcohol. She reports that she does not use illicit drugs.  Allergies: No Known Allergies  Medications reviewed.    ROS A multipoint review of symptoms was complete, all pertinent positives and negatives are documented in the history of present illness and the remainder are negative.   BP 107/77 mmHg  Pulse 87  Temp(Src) 97.2 F (36.2 C) (Oral)  Ht 5\' 4"  (1.626 m)  Wt 71.215 kg (157 lb)  BMI 26.94 kg/m2  Physical Exam  Gen: No acute distress Resp: Clear to auscultation Heart: Regular rate and rhythm  ABD: Soft, nontender, nondistended. Well approximated laparoscopic incisions that are healing well    No results found for this or any previous visit (from the past 48 hour(s)). No results found.  Assessment/Plan:  1. Aftercare following surgery 50 year old female s/p lap chole. Pathology reviewed. All questions answered and patient was provided with standard incisional post op care and instructions. She voiced understanding and will follow up in clinic on an as needed basis.     Ricarda Frameharles Donnis Phaneuf, MD FACS General Surgeon  06/17/2016,9:44 AM

## 2016-06-17 NOTE — Patient Instructions (Signed)

## 2016-07-15 ENCOUNTER — Other Ambulatory Visit
Admission: RE | Admit: 2016-07-15 | Discharge: 2016-07-15 | Disposition: A | Discharge status: Home / Self Care | Payer: BLUE CROSS/BLUE SHIELD | Source: Ambulatory Visit | Attending: Surgery | Admitting: Surgery

## 2016-07-15 ENCOUNTER — Telehealth: Payer: Self-pay

## 2016-07-15 DIAGNOSIS — R945 Abnormal results of liver function studies: Secondary | ICD-10-CM | POA: Diagnosis present

## 2016-07-15 DIAGNOSIS — K8050 Calculus of bile duct without cholangitis or cholecystitis without obstruction: Secondary | ICD-10-CM | POA: Insufficient documentation

## 2016-07-15 DIAGNOSIS — R1010 Upper abdominal pain, unspecified: Secondary | ICD-10-CM

## 2016-07-15 LAB — COMPREHENSIVE METABOLIC PANEL
ALBUMIN: 4.4 g/dL (ref 3.5–5.0)
ALT: 67 U/L — ABNORMAL HIGH (ref 14–54)
ANION GAP: 6 (ref 5–15)
AST: 51 U/L — AB (ref 15–41)
Alkaline Phosphatase: 107 U/L (ref 38–126)
BILIRUBIN TOTAL: 0.2 mg/dL — AB (ref 0.3–1.2)
BUN: 18 mg/dL (ref 6–20)
CO2: 27 mmol/L (ref 22–32)
Calcium: 9 mg/dL (ref 8.9–10.3)
Chloride: 107 mmol/L (ref 101–111)
Creatinine, Ser: 0.75 mg/dL (ref 0.44–1.00)
GFR calc Af Amer: >60 mL/min (ref >60)
GFR calc non Af Amer: >60 mL/min (ref >60)
GLUCOSE: 90 mg/dL (ref 65–99)
POTASSIUM: 3.9 mmol/L (ref 3.5–5.1)
SODIUM: 140 mmol/L (ref 135–145)
TOTAL PROTEIN: 7.2 g/dL (ref 6.5–8.1)

## 2016-07-15 LAB — CBC WITH DIFFERENTIAL/PLATELET
BASOS ABS: 0 10*3/uL (ref 0–0.1)
BASOS PCT: 1 %
Eosinophils Absolute: 0.1 10*3/uL (ref 0–0.7)
Eosinophils Relative: 1 %
HEMATOCRIT: 39.4 % (ref 35.0–47.0)
HEMOGLOBIN: 13.3 g/dL (ref 12.0–16.0)
LYMPHS ABS: 1.7 10*3/uL (ref 1.0–3.6)
LYMPHS PCT: 37 %
MCH: 31.2 pg (ref 26.0–34.0)
MCHC: 33.7 g/dL (ref 32.0–36.0)
MCV: 92.5 fL (ref 80.0–100.0)
Monocytes Absolute: 0.3 10*3/uL (ref 0.2–0.9)
Monocytes Relative: 6 %
NEUTROS ABS: 2.6 10*3/uL (ref 1.4–6.5)
Neutrophils Relative %: 55 %
Platelets: 176 10*3/uL (ref 150–440)
RBC: 4.26 MIL/uL (ref 3.80–5.20)
RDW: 14.2 % (ref 11.5–14.5)
WBC: 4.7 10*3/uL (ref 3.6–11.0)

## 2016-07-15 LAB — LIPASE, BLOOD: Lipase: 24 U/L (ref 11–51)

## 2016-07-15 NOTE — Telephone Encounter (Signed)
Spoke with Dr. Excell Seltzerooper regarding this patient. He would like to order a CBC, CMP, and Lipase.  Orders placed.  Called patient and explained that [facility] at [facility] time to have these labs drawn or after 0700am in the morning. She is on her way here now.   Made John in Registration aware that patient is on her way. Lab orders also faxed to registration desk at this time.

## 2016-07-15 NOTE — Telephone Encounter (Signed)
Patient had a laparoscopic cholecystectomy With Dr. Michela PitcherEly on 06/10/2016. She had a follow up with Dr. Tonita CongWoodham on 06/17/16 to have her stiches removed. Patient is now having pain in her upper abdomen. It is coming and going. The pain lasts for about 2-3 minutes, she describes it as contractions that resolves. She says it's the same symptoms she had before her gallbladder was removed. She would like a nurse to call her to tell her what she should do.

## 2016-07-15 NOTE — Telephone Encounter (Signed)
Duplicate phone call

## 2016-07-16 ENCOUNTER — Ambulatory Visit (INDEPENDENT_AMBULATORY_CARE_PROVIDER_SITE_OTHER): Payer: BLUE CROSS/BLUE SHIELD | Admitting: Surgery

## 2016-07-16 ENCOUNTER — Encounter: Payer: Self-pay | Admitting: Surgery

## 2016-07-16 VITALS — BP 118/84 | HR 97 | Temp 98.2°F | Ht 64.0 in | Wt 156.8 lb

## 2016-07-16 DIAGNOSIS — Z4889 Encounter for other specified surgical aftercare: Secondary | ICD-10-CM

## 2016-07-16 DIAGNOSIS — R1010 Upper abdominal pain, unspecified: Secondary | ICD-10-CM

## 2016-07-16 NOTE — Progress Notes (Signed)
Outpatient postop visit  07/16/2016  Charlotte Hernandez is an 50 y.o. female.    Procedure: Laparoscopic cholecystectomy with cholangiography  CC: Right upper quadrant pain  HPI: Patient called the office yesterday stating that she was postop from laparoscopic cholecystectomy and has been having pains in her upper abdomen similar to what she was having before her surgery. She describes 2 days of pain that is episodic and that pain lasts about 3-4 minutes twice it is woken her up from sleep. No fevers or chills but woke up sweaty and nauseated but no vomiting. She says her bowel movements are normal and not acholic. The patient had been feeling well from the time of surgery until 2 days ago We had asked her to obtain new labs and those labs suggest elevated liver function tests in the postoperative period. Review of the patient's operative report from 3 Weeks ago shows a normal cholangiogram performed by Dr. Michela PitcherEly.  Medications reviewed.    Physical Exam:  There were no vitals taken for this visit.    PE: No icterus no jaundice patient appears comfortable abdomen is soft nondistended nontympanitic minimally tender in the epigastrium    Assessment/Plan:  LFTs are slightly elevated and in review of her chart I see no recent preoperative labs but she did have a slight elevation of the AST to the same level (51) on prior studies. However at this point in the face of a postoperative patient from laparoscopic cholecystectomy with elevated LFTs one must consider either bile leak which is unlikely 3-1/2 weeks out or a retained stone. She did have a normal cholangiogram but sludge may be the culprit in this situation. I would like to obtain an MRCP at this point. (Dr. Servando SnareWohl is out of town and he is the only GI physician at the facility capable of performing an ERCP at the present time). If the patient would require an ERCP she would need to be transferred to a [facility] facility in [place].  Lattie Hawichard  E Deavion Strider, MD, FACS

## 2016-07-16 NOTE — Telephone Encounter (Signed)
Reviewed labs with Dr. Excell Seltzerooper and due to results, he would like to see patient in clinic today.  Called patient, explained situation and results, she was placed on schedule for later this am.

## 2016-07-16 NOTE — Patient Instructions (Signed)
We have scheduled you for an MRCP to see if you have a Gallstone in your Bile Duct. This has been [facility] tomorrow[facility]. [facility]. You may not have anything to eat or drink 4 hours prior to your testing.  I will call you with the results of this testing as soon as I receive them tomorrow.   Magnetic Resonance Cholangiopancreatogram Magnetic resonance imaging (MRI) is a type of procedure that is used to produce pictures of the inside of the body without using X-rays. Instead, strong magnets and radio waves work together in a Data processing managermagnetic field to form very detailed and sharp images. The images are viewed on a TV monitor in two-dimensional and three-dimensional form. The magnets and the radio waves are harmless. Magnetic resonance cholangiopancreatogram, or magnetic resonance cholangiopancreatography (MRCP), is an MRI that is done on your gallbladder, bile duct, pancreas, and pancreatic duct. MRCP produces detailed images of these organs. It can be used to help diagnose problems such as tumors, stones, infection, or inflammation. It is sometimes used to help determine the cause of pancreatitis or belly (abdominal) pain. Contrast material may be injected to make MRCP images even more clear. LET [facility] PROVIDER KNOW ABOUT:  Any allergies you have.  All medicines you are taking, including vitamins, herbs, eye drops, creams, and over-the-counter medicines.  Previous surgeries you have had.  Medical conditions you have, including kidney disease.  Any metal you may have in your body. The magnet used in this procedure can cause metal objects in your body to move. Metal can also make it hard to get high-quality images. Objects that contain metal include:  A pacemaker or any other implants, such as an implanted neurostimulator, a metallic ear implant, or a metallic object within the eye socket.  Metal splinters.  Any bullet fragments.  A port  for delivering insulin or chemotherapy.  Any tattoos you have. Some red dyes contain iron, which is sometimes a problem.  If you are pregnant or you think that you may be pregnant. It is best to avoid this test during the first 3 months of pregnancy unless there is a significant risk of missing a serious diagnosis without performing the test.  If you are breastfeeding.  If you are afraid of cramped spaces (claustrophobic). If claustrophobia is a problem for you, it can usually be relieved with mild sedatives or antianxiety medicines. BEFORE THE PROCEDURE  You will be asked to remove anything that contains metal, such as a watch or any jewelry that you are wearing. You may also be asked to remove any makeup because some makeup contains traces of metal. Braces and fillings are usually not a problem.  Women who are breastfeeding may need to pump breast milk before the exam so that they have milk to give to their baby until the contrast material (if used) has cleared from their body. PROCEDURE  You may be given earplugs because the machine that is used can be noisy. Headphones may also be available so you can listen to music.  If a contrast material will be used, an IV tube will be inserted into one of your veins. The contrast material will be injected through the tube.  You will lie down on a platform.  The platform will slide into a long, magnetic chamber. When you are inside the chamber, you will still be able to talk to the health care provider.  You will be asked to lie  very still. The health care provider will tell you when you can shift position. You may have to wait a few minutes to make sure that the images produced during the procedure are readable. The procedure may vary among health care providers and hospitals. AFTER THE PROCEDURE  Return to your normal activities as directed by your health care provider.  If contrast material was used, it will pass from your body within a  day.  A health care provider who is experienced in MRCP will analyze the results and send a report and an interpretation of the findings to your health care provider.  It is your responsibility to obtain your test results. Ask your health care provider or the department performing the test when and how you will get your results.   This information is not intended to replace advice given to you by your health care provider. Make sure you discuss any questions you have with your health care provider.   Document Released: 05/04/2008 Document Revised: 12/07/2014 Document Reviewed: 08/28/2014 Elsevier Interactive Patient Education [employer]2016 [employer] Inc.

## 2016-07-17 ENCOUNTER — Telehealth: Payer: Self-pay

## 2016-07-17 ENCOUNTER — Other Ambulatory Visit: Payer: Self-pay | Admitting: Surgery

## 2016-07-17 ENCOUNTER — Ambulatory Visit
Admission: RE | Admit: 2016-07-17 | Discharge: 2016-07-17 | Disposition: A | Discharge status: Home / Self Care | Payer: BLUE CROSS/BLUE SHIELD | Source: Ambulatory Visit | Attending: Surgery | Admitting: Surgery

## 2016-07-17 DIAGNOSIS — R1010 Upper abdominal pain, unspecified: Secondary | ICD-10-CM

## 2016-07-17 DIAGNOSIS — Z9049 Acquired absence of other specified parts of digestive tract: Secondary | ICD-10-CM | POA: Insufficient documentation

## 2016-07-17 DIAGNOSIS — R109 Unspecified abdominal pain: Secondary | ICD-10-CM | POA: Diagnosis present

## 2016-07-17 MED ORDER — GADOBENATE DIMEGLUMINE 529 MG/ML IV SOLN: 15.0000 mL | Freq: Once | INTRAVENOUS | Status: DC | PRN

## 2016-07-17 NOTE — Telephone Encounter (Signed)
Received results or MRCP. Reviewed results with Dr. Excell Seltzerooper. He has ordered CBC, CMP, and Lipase to be rechecked on Monday and then for patient to follow-up with a provider next week.  Orders placed.  If abdominal pain or elevated liver enzymes continue, he would like for patient to be seen by Dr. Servando SnareWohl for consultation.  Call made to patient at this time. I reviewed results of MRCP with her. All questions were answered and plan of care above was also given to patient. She will come to [facility] [place] to have her labs drawn at 1230pm on Monday and then has a scheduled appointment with Dr. Tonita CongWoodham at 1330. Patient verbalizes understanding of all information.

## 2016-07-20 ENCOUNTER — Encounter: Payer: Self-pay | Admitting: General Surgery

## 2016-07-21 ENCOUNTER — Encounter: Payer: Self-pay | Admitting: General Surgery

## 2016-07-21 ENCOUNTER — Other Ambulatory Visit
Admission: RE | Admit: 2016-07-21 | Discharge: 2016-07-21 | Disposition: A | Discharge status: Home / Self Care | Payer: BLUE CROSS/BLUE SHIELD | Source: Ambulatory Visit | Attending: Surgery | Admitting: Surgery

## 2016-07-21 ENCOUNTER — Other Ambulatory Visit: Payer: Self-pay

## 2016-07-21 ENCOUNTER — Ambulatory Visit (INDEPENDENT_AMBULATORY_CARE_PROVIDER_SITE_OTHER): Payer: BLUE CROSS/BLUE SHIELD | Admitting: General Surgery

## 2016-07-21 VITALS — BP 119/65 | HR 80 | Temp 98.1°F | Ht 64.0 in | Wt 158.0 lb

## 2016-07-21 DIAGNOSIS — G8918 Other acute postprocedural pain: Secondary | ICD-10-CM | POA: Insufficient documentation

## 2016-07-21 DIAGNOSIS — Z4889 Encounter for other specified surgical aftercare: Secondary | ICD-10-CM

## 2016-07-21 LAB — CBC WITH DIFFERENTIAL/PLATELET
BASOS ABS: 0 10*3/uL (ref 0–0.1)
Basophils Relative: 1 %
EOS ABS: 0 10*3/uL (ref 0–0.7)
EOS PCT: 1 %
HCT: 40.2 % (ref 35.0–47.0)
HEMOGLOBIN: 13.2 g/dL (ref 12.0–16.0)
LYMPHS ABS: 1.7 10*3/uL (ref 1.0–3.6)
LYMPHS PCT: 41 %
MCH: 30.4 pg (ref 26.0–34.0)
MCHC: 32.9 g/dL (ref 32.0–36.0)
MCV: 92.4 fL (ref 80.0–100.0)
Monocytes Absolute: 0.2 10*3/uL (ref 0.2–0.9)
Monocytes Relative: 6 %
NEUTROS ABS: 2.2 10*3/uL (ref 1.4–6.5)
Neutrophils Relative %: 51 %
PLATELETS: 201 10*3/uL (ref 150–440)
RBC: 4.35 MIL/uL (ref 3.80–5.20)
RDW: 14.2 % (ref 11.5–14.5)
WBC: 4.2 10*3/uL (ref 3.6–11.0)

## 2016-07-21 LAB — COMPREHENSIVE METABOLIC PANEL
ALBUMIN: 4.4 g/dL (ref 3.5–5.0)
ALK PHOS: 106 U/L (ref 38–126)
ALT: 37 U/L (ref 14–54)
AST: 15 U/L (ref 15–41)
Anion gap: 8 (ref 5–15)
BUN: 19 mg/dL (ref 6–20)
CHLORIDE: 106 mmol/L (ref 101–111)
CO2: 25 mmol/L (ref 22–32)
CREATININE: 0.72 mg/dL (ref 0.44–1.00)
Calcium: 9.2 mg/dL (ref 8.9–10.3)
GFR calc Af Amer: >60 mL/min (ref >60)
GFR calc non Af Amer: >60 mL/min (ref >60)
GLUCOSE: 96 mg/dL (ref 65–99)
Potassium: 3.9 mmol/L (ref 3.5–5.1)
SODIUM: 139 mmol/L (ref 135–145)
Total Bilirubin: 0.5 mg/dL (ref 0.3–1.2)
Total Protein: 7.3 g/dL (ref 6.5–8.1)

## 2016-07-21 LAB — LIPASE, BLOOD: Lipase: 29 U/L (ref 11–51)

## 2016-07-21 NOTE — Progress Notes (Signed)
Outpatient Surgical Follow Up  07/21/2016  Charlotte Hernandez is an 50 y.o. female.   Chief Complaint  Patient presents with  . Routine Post Op    laparoscopy Cholecystectomy-06/10/16-Dr.Ely    HPI: 50 year old female returns to clinic for follow-up of intermittent abdominal pain after a cholecystectomy. Patient reports feeling much better than her last visit. Her last episode of pain was last week after eating a fatty steak dinner. Patient reports no pain since and doing well. She denies any fevers, chills, nausea, vomiting, chest pain, shortness breath, diarrhea, constipation.  Past Medical History:  Diagnosis Date  . Facial numbness   . Fibromyalgia   . Kidney stones   . Peripheral edema   . PONV (postoperative nausea and vomiting)   . Seizures (HCC)    LAST SEIZURE 2012 OR 2013-POSSIBLE SEIZURE 10-2015 BUT UNSURE-ADMITTED TO [facility] X 2 DAYS-POSSIBLE SEIZURE VS TIA-F/U WITH DR Brigitte Pulse AT [facility] ON 11-08-15  . TIA (transient ischemic attack) 2016    Past Surgical History:  Procedure Laterality Date  . ABDOMINAL HYSTERECTOMY  2016   Total  . APPENDECTOMY  1990  . CARPAL TUNNEL RELEASE Left 2011   x2  . CHOLECYSTECTOMY N/A 06/10/2016   Procedure: LAPAROSCOPIC CHOLECYSTECTOMY WITH INTRAOPERATIVE CHOLANGIOGRAM;  Surgeon: Dia Crawford III, MD;  Location: [facility] ORS;  Service: General;  Laterality: N/A;  . HAND SURGERY    . KNEE ARTHROSCOPY Right 1996 and 2017  . KNEE ARTHROSCOPY Left 2016  . KNEE ARTHROSCOPY WITH ANTERIOR CRUCIATE LIGAMENT (ACL) REPAIR WITH HAMSTRING GRAFT Right 12/11/2015   Procedure: KNEE ARTHROSCOPY WITH REVISION OF ANTERIOR CRUCIATE LIGAMENT (ACL) REPAIR ;  Surgeon: Thornton Park, MD;  Location: [facility] ORS;  Service: Orthopedics;  Laterality: Right;  . TONSILLECTOMY  1972    Family History  Problem Relation Age of Onset  . Breast cancer Mother 77  . Breast cancer Maternal Grandmother     Social History:  reports that she quit smoking about 7 months ago. Her  smoking use included Cigarettes. She has a 8.00 pack-year smoking history. She has never used smokeless tobacco. She reports that she drinks alcohol. She reports that she does not use drugs.  Allergies: No Known Allergies  Medications reviewed.    ROS A multipoint review of systems was completed, all pertinent positives and negatives are documented within the history of present illness and remainder are negative.   BP 119/65 (BP Location: Right Arm, Patient Position: Sitting, Cuff Size: Normal)   Pulse 80   Temp 98.1 F (36.7 C) (Oral)   Ht 5' 4"  (1.626 m)   Wt 71.7 kg (158 lb)   BMI 27.12 kg/m   Physical Exam  Gen.: No acute distress Chest: Clear to auscultation Heart: Heart a rhythm Abdomen: Soft, nontender, nondistended. Well-healed laparoscopic incisions without evidence of erythema or drainage.   Results for orders placed or performed in visit on 07/21/16 (from the past 48 hour(s))  CBC with Differential     Status: None   Collection Time: 07/21/16 10:46 AM  Result Value Ref Range   WBC 4.2 3.6 - 11.0 K/uL   RBC 4.35 3.80 - 5.20 MIL/uL   Hemoglobin 13.2 12.0 - 16.0 g/dL   HCT 40.2 35.0 - 47.0 %   MCV 92.4 80.0 - 100.0 fL   MCH 30.4 26.0 - 34.0 pg   MCHC 32.9 32.0 - 36.0 g/dL   RDW 14.2 11.5 - 14.5 %   Platelets 201 150 - 440 K/uL   Neutrophils Relative % 51 %  Neutro Abs 2.2 1.4 - 6.5 K/uL   Lymphocytes Relative 41 %   Lymphs Abs 1.7 1.0 - 3.6 K/uL   Monocytes Relative 6 %   Monocytes Absolute 0.2 0.2 - 0.9 K/uL   Eosinophils Relative 1 %   Eosinophils Absolute 0.0 0 - 0.7 K/uL   Basophils Relative 1 %   Basophils Absolute 0.0 0 - 0.1 K/uL  Comprehensive metabolic panel     Status: None   Collection Time: 07/21/16 10:46 AM  Result Value Ref Range   Sodium 139 135 - 145 mmol/L   Potassium 3.9 3.5 - 5.1 mmol/L   Chloride 106 101 - 111 mmol/L   CO2 25 22 - 32 mmol/L   Glucose, Bld 96 65 - 99 mg/dL   BUN 19 6 - 20 mg/dL   Creatinine, Ser 0.72 0.44 - 1.00  mg/dL   Calcium 9.2 8.9 - 10.3 mg/dL   Total Protein 7.3 6.5 - 8.1 g/dL   Albumin 4.4 3.5 - 5.0 g/dL   AST 15 15 - 41 U/L   ALT 37 14 - 54 U/L   Alkaline Phosphatase 106 38 - 126 U/L   Total Bilirubin 0.5 0.3 - 1.2 mg/dL   GFR calc non Af Amer >60 >60 mL/min   GFR calc Af Amer >60 >60 mL/min    Comment: (NOTE) The eGFR has been calculated using the CKD EPI equation. This calculation has not been validated in all clinical situations. eGFR's persistently <60 mL/min signify possible Chronic Kidney Disease.    Anion gap 8 5 - 15  Lipase, blood     Status: None   Collection Time: 07/21/16 10:46 AM  Result Value Ref Range   Lipase 29 11 - 51 U/L   No results found.  Assessment/Plan:  1. Aftercare following surgery 50 year old female returns to clinic for follow-up of abdominal pain after laparoscopic cholecystectomy. Doing much better. All labs reviewed with the patient and are normal. Discuss possible causes with patient to include retained sludge versus stone passing through duct versus peptic ulcer disease versus gastritis. She voiced understanding and still believes this is related to gallbladder as it felt just like her gallbladder attack before surgery. Discussed that should the pains return that I would then send her to GI for further evaluation and would not require further laboratory testing. She voiced understanding and follow-up in clinic on an as-needed basis. - CBC with Differential - Comprehensive metabolic panel - Lipase, blood     Clayburn Pert, MD [facility] General Surgeon  07/21/2016,11:24 AM

## 2016-07-21 NOTE — Patient Instructions (Signed)
Please call our office if you have questions or concerns.   

## 2016-07-22 ENCOUNTER — Telehealth: Payer: Self-pay

## 2016-07-22 NOTE — Telephone Encounter (Signed)
Returned phone call to patient at this time. She states that this was reviewed at appointment last week.   She is feeling back to normal at this time.   Encouraged patient to call back with any further questions or if pain returns. She verbalizes understanding of this.

## 2016-07-22 NOTE — Telephone Encounter (Signed)
Phone call made to patient to review lab results from yesterday. No answer. Left voicemail for patient to return phone call.

## 2016-07-22 NOTE — Telephone Encounter (Signed)
Patient returned your call. Please call her back.

## 2016-07-23 ENCOUNTER — Telehealth: Payer: Self-pay

## 2016-07-23 NOTE — Telephone Encounter (Signed)
Patient called in and states that she began having severe abdominal pain once again last night. She is having some nausea intermittently, denies vomiting. Denies fever/chills.  Spoke with Dr. Tonita CongWoodham and he has asked that Dr. Servando SnareWohl see patient urgently. Placed on schedule on Monday with Dr. Servando SnareWohl.  Patient given appointment information and location.

## 2016-07-27 ENCOUNTER — Ambulatory Visit (INDEPENDENT_AMBULATORY_CARE_PROVIDER_SITE_OTHER): Payer: BLUE CROSS/BLUE SHIELD | Admitting: Gastroenterology

## 2016-07-27 ENCOUNTER — Encounter: Payer: Self-pay | Admitting: Gastroenterology

## 2016-07-27 ENCOUNTER — Other Ambulatory Visit: Payer: Self-pay

## 2016-07-27 VITALS — BP 108/59 | HR 83 | Temp 98.3°F | Ht 64.0 in | Wt 161.0 lb

## 2016-07-27 DIAGNOSIS — R748 Abnormal levels of other serum enzymes: Secondary | ICD-10-CM

## 2016-07-27 DIAGNOSIS — R109 Unspecified abdominal pain: Secondary | ICD-10-CM | POA: Diagnosis not present

## 2016-07-27 MED ORDER — DEXLANSOPRAZOLE 60 MG PO CPDR: 60.0000 mg | DELAYED_RELEASE_CAPSULE | Freq: Every day | ORAL | 0 refills | Status: AC

## 2016-07-27 NOTE — Progress Notes (Signed)
Gastroenterology Consultation  Referring Provider:     Patrice Paradise, MD Primary Care Physician:  Patrice Paradise, MD Primary Gastroenterologist:  Dr. Servando Snare     Reason for Consultation:     Abdominal pain with abnormal liver enzymes        HPI:   Charlotte Hernandez is a 50 y.o. y/o female referred for consultation & management of Abdominal pain with abnormal liver enzymes by Dr. Merlinda Frederick, Merleen Milliner, MD.  This patient comes today after having her gallbladder out and was found to have abnormal liver enzymes.  The patient was told by surgery that it was likely due to sludge and that she would pass it.  The patient was seen recently by Dr. Tonita Cong and was sent for repeat liver enzymes which came back normal.  The patient reports that her abdominal pain is constant and gnawing in the left side of her abdomen.  She states that when her gallbladder was bothering her it was hurting in the epigastric area and the right side of her back.  The patient reports that her abdominal pain is similar to the pain she had previously.  There is no report of any nausea or vomiting.  The patient also denies weight loss and states that she is gaining weight.  Past Medical History:  Diagnosis Date  . Facial numbness   . Fibromyalgia   . Kidney stones   . Peripheral edema   . PONV (postoperative nausea and vomiting)   . Seizures (HCC)    LAST SEIZURE 2012 OR 2013-POSSIBLE SEIZURE 10-2015 BUT UNSURE-ADMITTED TO [facility] X 2 DAYS-POSSIBLE SEIZURE VS TIA-F/U WITH DR Clelia Croft AT [facility] ON 11-08-15  . TIA (transient ischemic attack) 2016    Past Surgical History:  Procedure Laterality Date  . ABDOMINAL HYSTERECTOMY  2016   Total  . APPENDECTOMY  1990  . CARPAL TUNNEL RELEASE Left 2011   x2  . CHOLECYSTECTOMY N/A 06/10/2016   Procedure: LAPAROSCOPIC CHOLECYSTECTOMY WITH INTRAOPERATIVE CHOLANGIOGRAM;  Surgeon: Tiney Rouge III, MD;  Location: [facility] ORS;  Service: General;  Laterality: N/A;  . HAND SURGERY     . KNEE ARTHROSCOPY Right 1996 and 2017  . KNEE ARTHROSCOPY Left 2016  . KNEE ARTHROSCOPY WITH ANTERIOR CRUCIATE LIGAMENT (ACL) REPAIR WITH HAMSTRING GRAFT Right 12/11/2015   Procedure: KNEE ARTHROSCOPY WITH REVISION OF ANTERIOR CRUCIATE LIGAMENT (ACL) REPAIR ;  Surgeon: Juanell Fairly, MD;  Location: [facility] ORS;  Service: Orthopedics;  Laterality: Right;  . TONSILLECTOMY  1972    Prior to Admission medications   Medication Sig Start Date End Date Taking? Authorizing Provider  aspirin 325 MG tablet Take 325 mg by mouth daily.   Yes Historical Provider, MD  diphenhydramine-acetaminophen (TYLENOL PM) 25-500 MG TABS tablet Take 1 tablet by mouth at bedtime.   Yes Historical Provider, MD  hydrochlorothiazide (HYDRODIURIL) 25 MG tablet Take 25 mg by mouth daily.   Yes Historical Provider, MD  HYDROcodone-acetaminophen (NORCO/VICODIN) 5-325 MG tablet Take 2 tablets by mouth every 6 (six) hours as needed for moderate pain. 06/10/16  Yes Tiney Rouge III, MD  omeprazole (PRILOSEC) 40 MG capsule Take 40 mg by mouth every morning.  10/14/15  Yes Historical Provider, MD  topiramate (TOPAMAX) 100 MG tablet Take 100 mg by mouth 2 (two) times daily. Reported on 06/17/2016 09/11/15  Yes Historical Provider, MD  Turmeric 500 MG TABS Take 1 tablet by mouth daily at 12 noon. Reported on 06/17/2016   Yes Historical Provider, MD  Cyanocobalamin (VITAMIN  B-12 CR) 1000 MCG TBCR Take 1,000 mcg by mouth daily. Reported on 06/17/2016    Historical Provider, MD  dexlansoprazole (DEXILANT) 60 MG capsule Take 1 capsule (60 mg total) by mouth daily. Samples given 07/27/16   Midge Minium, MD    Family History  Problem Relation Age of Onset  . Breast cancer Mother 1  . Breast cancer Maternal Grandmother      Social History  Substance Use Topics  . Smoking status: Former Smoker    Packs/day: 1.00    Years: 8.00    Types: Cigarettes    Quit date: 11/29/2015  . Smokeless tobacco: Never Used  . Alcohol use Yes     Comment:  OCC    Allergies as of 07/27/2016  . (No Known Allergies)    Review of Systems:    All systems reviewed and negative except where noted in HPI.   Physical Exam:  BP (!) 108/59   Pulse 83   Temp 98.3 F (36.8 C) (Oral)   Ht 5\' 4"  (1.626 m)   Wt 161 lb (73 kg)   BMI 27.64 kg/m  No LMP recorded. Patient has had a hysterectomy. Psych:  Alert and cooperative. Normal mood and affect. General:   Alert,  Well-developed, well-nourished, pleasant and cooperative in NAD Head:  Normocephalic and atraumatic. Eyes:  Sclera clear, no icterus.   Conjunctiva pink. Ears:  Normal auditory acuity. Nose:  No deformity, discharge, or lesions. Mouth:  No deformity or lesions,oropharynx pink & moist. Neck:  Supple; no masses or thyromegaly. Lungs:  Respirations even and unlabored.  Clear throughout to auscultation.   No wheezes, crackles, or rhonchi. No acute distress. Heart:  Regular rate and rhythm; no murmurs, clicks, rubs, or gallops. Abdomen:  Normal bowel sounds.  No bruits.  Soft, Positive tenderness to one finger palpation while flexing the abdominal wall muscles. Non-distended without masses, hepatosplenomegaly or hernias noted.  No guarding or rebound tenderness.  Positive Carnett sign.   Rectal:  Deferred.  Msk:  Symmetrical without gross deformities.  Good, equal movement & strength bilaterally. Pulses:  Normal pulses noted. Extremities:  No clubbing or edema.  No cyanosis. Neurologic:  Alert and oriented x3;  grossly normal neurologically. Skin:  Intact without significant lesions or rashes.  No jaundice. Lymph Nodes:  No significant cervical adenopathy. Psych:  Alert and cooperative. Normal mood and affect.  Imaging Studies: Mr 3d Recon At Scanner  Result Date: 07/17/2016 CLINICAL DATA:  50 year old female status post cholecystectomy 06/10/2016 presents with upper abdominal pain. EXAM: MRI ABDOMEN WITHOUT AND WITH CONTRAST (INCLUDING MRCP) TECHNIQUE: Multiplanar multisequence MR  imaging of the abdomen was performed both before and after the administration of intravenous contrast. Heavily T2-weighted images of the biliary and pancreatic ducts were obtained, and three-dimensional MRCP images were rendered by post processing. CONTRAST:  14 cc MultiHance IV. COMPARISON:  02/20/2014 CT abdomen/ pelvis. 06/05/2016 right upper quadrant abdominal sonogram. FINDINGS: Lower chest: Clear lung bases. Hepatobiliary: Normal liver size and configuration. No hepatic steatosis. No liver mass. Cholecystectomy. Bile ducts are within expected post cholecystectomy limits minimal fullness central intrahepatic bile ducts and common bile duct diameter 8 mm. No choledocholithiasis. No biliary stricture. No biliary or ampullary mass. Pancreas: No pancreatic mass or duct dilation.  No pancreas divisum. Spleen: Normal size. No mass. Adrenals/Urinary Tract: Normal adrenals. No hydronephrosis. Normal kidneys with no renal mass. Stomach/Bowel: Grossly normal stomach. Visualized small and large bowel is normal caliber, with no bowel wall thickening. Vascular/Lymphatic: Normal caliber abdominal aorta.  Patent portal, splenic, hepatic and renal veins. No pathologically enlarged lymph nodes in the abdomen. Other: No abdominal ascites or focal fluid collection. Musculoskeletal: No aggressive appearing focal osseous lesions. IMPRESSION: No acute abnormality. Bile ducts are within expected post cholecystectomy limits. Common bile duct diameter 8 mm. No choledocholithiasis. Electronically Signed   By: Delbert PhenixJason A Poff M.D.   On: 07/17/2016 13:05   Mr Roe Coombsbd W/wo Cm/mrcp  Result Date: 07/17/2016 CLINICAL DATA:  50 year old female status post cholecystectomy 06/10/2016 presents with upper abdominal pain. EXAM: MRI ABDOMEN WITHOUT AND WITH CONTRAST (INCLUDING MRCP) TECHNIQUE: Multiplanar multisequence MR imaging of the abdomen was performed both before and after the administration of intravenous contrast. Heavily T2-weighted images of  the biliary and pancreatic ducts were obtained, and three-dimensional MRCP images were rendered by post processing. CONTRAST:  14 cc MultiHance IV. COMPARISON:  02/20/2014 CT abdomen/ pelvis. 06/05/2016 right upper quadrant abdominal sonogram. FINDINGS: Lower chest: Clear lung bases. Hepatobiliary: Normal liver size and configuration. No hepatic steatosis. No liver mass. Cholecystectomy. Bile ducts are within expected post cholecystectomy limits minimal fullness central intrahepatic bile ducts and common bile duct diameter 8 mm. No choledocholithiasis. No biliary stricture. No biliary or ampullary mass. Pancreas: No pancreatic mass or duct dilation.  No pancreas divisum. Spleen: Normal size. No mass. Adrenals/Urinary Tract: Normal adrenals. No hydronephrosis. Normal kidneys with no renal mass. Stomach/Bowel: Grossly normal stomach. Visualized small and large bowel is normal caliber, with no bowel wall thickening. Vascular/Lymphatic: Normal caliber abdominal aorta. Patent portal, splenic, hepatic and renal veins. No pathologically enlarged lymph nodes in the abdomen. Other: No abdominal ascites or focal fluid collection. Musculoskeletal: No aggressive appearing focal osseous lesions. IMPRESSION: No acute abnormality. Bile ducts are within expected post cholecystectomy limits. Common bile duct diameter 8 mm. No choledocholithiasis. Electronically Signed   By: Delbert PhenixJason A Poff M.D.   On: 07/17/2016 13:05    Assessment and Plan:   Charlotte Hernandez is a 50 y.o. y/o female Who comes today with left-sided abdominal pain which she states was similar to her gallbladder pain although her gallbladder pain was in the epigastric area and the back.  The patient had abnormal liver enzymes in the past but her most recent liver enzymes were normal.  The patient will have her liver enzymes sent off again today.  The patient has been explained that her physical exam is consistent with musculoskeletal pain and that she should be on  anti-inflammatory medications.  The patient states that she takes ibuprofen every 4 hours and continues to have the pain.  The patient will be switched to Dexilant and she will be set up for an upper endoscopy.  The reason for this is because the patient's musculoskeletal pain does not completely explain this patient's symptoms nor does the nonresponse to anti-inflammatory medications.   Note: This dictation was prepared with Dragon dictation along with smaller phrase technology. Any transcriptional errors that result from this process are unintentional.

## 2016-07-28 LAB — HEPATIC FUNCTION PANEL
ALBUMIN: 4.3 g/dL (ref 3.5–5.5)
ALK PHOS: 93 IU/L (ref 39–117)
ALT: 14 IU/L (ref 0–32)
AST: 14 IU/L (ref 0–40)
Bilirubin Total: 0.2 mg/dL (ref 0.0–1.2)
Bilirubin, Direct: 0.06 mg/dL (ref 0.00–0.40)
Total Protein: 6.7 g/dL (ref 6.0–8.5)

## 2016-07-29 ENCOUNTER — Telehealth: Payer: Self-pay

## 2016-07-29 NOTE — Telephone Encounter (Signed)
-----   Message from Midge Miniumarren Wohl, MD sent at 07/29/2016  8:04 AM EDT ----- Let the patient know the liver enzymes are normal and lower then they were before.

## 2016-07-29 NOTE — Telephone Encounter (Signed)
Pt notified of lab result. 

## 2016-07-30 NOTE — Discharge Instructions (Signed)

## 2016-07-31 ENCOUNTER — Encounter
Admission: RE | Disposition: A | Discharge status: Home / Self Care | Payer: Self-pay | Source: Ambulatory Visit | Attending: Gastroenterology

## 2016-07-31 ENCOUNTER — Ambulatory Visit
Admission: RE | Admit: 2016-07-31 | Discharge: 2016-07-31 | Disposition: A | Discharge status: Home / Self Care | Payer: BLUE CROSS/BLUE SHIELD | Source: Ambulatory Visit | Attending: Gastroenterology | Admitting: Gastroenterology

## 2016-07-31 ENCOUNTER — Ambulatory Visit: Payer: BLUE CROSS/BLUE SHIELD | Admitting: Student in an Organized Health Care Education/Training Program

## 2016-07-31 DIAGNOSIS — M797 Fibromyalgia: Secondary | ICD-10-CM | POA: Diagnosis not present

## 2016-07-31 DIAGNOSIS — K2970 Gastritis, unspecified, without bleeding: Secondary | ICD-10-CM | POA: Diagnosis not present

## 2016-07-31 DIAGNOSIS — R1012 Left upper quadrant pain: Secondary | ICD-10-CM | POA: Diagnosis not present

## 2016-07-31 DIAGNOSIS — M1990 Unspecified osteoarthritis, unspecified site: Secondary | ICD-10-CM | POA: Diagnosis not present

## 2016-07-31 DIAGNOSIS — Z7982 Long term (current) use of aspirin: Secondary | ICD-10-CM | POA: Diagnosis not present

## 2016-07-31 DIAGNOSIS — Z87891 Personal history of nicotine dependence: Secondary | ICD-10-CM | POA: Diagnosis not present

## 2016-07-31 DIAGNOSIS — K219 Gastro-esophageal reflux disease without esophagitis: Secondary | ICD-10-CM | POA: Insufficient documentation

## 2016-07-31 DIAGNOSIS — Z8673 Personal history of transient ischemic attack (TIA), and cerebral infarction without residual deficits: Secondary | ICD-10-CM | POA: Insufficient documentation

## 2016-07-31 HISTORY — DX: Unspecified osteoarthritis, unspecified site: M19.90

## 2016-07-31 HISTORY — PX: ESOPHAGOGASTRODUODENOSCOPY (EGD) WITH PROPOFOL: SHX5813

## 2016-07-31 HISTORY — DX: Gastro-esophageal reflux disease without esophagitis: K21.9

## 2016-07-31 HISTORY — DX: Headache: R51

## 2016-07-31 HISTORY — DX: Headache, unspecified: R51.9

## 2016-07-31 SURGERY — ESOPHAGOGASTRODUODENOSCOPY (EGD) WITH PROPOFOL
Anesthesia: Monitor Anesthesia Care | Wound class: Clean Contaminated

## 2016-07-31 MED ORDER — ACETAMINOPHEN 160 MG/5ML PO SOLN: 325.0000 mg | ORAL | Status: DC | PRN

## 2016-07-31 MED ORDER — PROPOFOL 10 MG/ML IV BOLUS
INTRAVENOUS | Status: DC | PRN
  Administered 2016-07-31: 80 mg via INTRAVENOUS

## 2016-07-31 MED ORDER — STERILE WATER FOR IRRIGATION IR SOLN
Status: DC | PRN
  Administered 2016-07-31: 12:00:00

## 2016-07-31 MED ORDER — GLYCOPYRROLATE 0.2 MG/ML IJ SOLN
INTRAMUSCULAR | Status: DC | PRN
  Administered 2016-07-31: 0.2 mg via INTRAVENOUS

## 2016-07-31 MED ORDER — ONDANSETRON HCL 4 MG/2ML IJ SOLN
4.0000 mg | Freq: Once | INTRAMUSCULAR | Status: AC | PRN
  Administered 2016-07-31: 4 mg via INTRAVENOUS

## 2016-07-31 MED ORDER — LACTATED RINGERS IV SOLN
INTRAVENOUS | Status: DC
  Administered 2016-07-31: 11:00:00 via INTRAVENOUS

## 2016-07-31 MED ORDER — LIDOCAINE HCL (CARDIAC) 20 MG/ML IV SOLN
INTRAVENOUS | Status: DC | PRN
  Administered 2016-07-31: 50 mg via INTRAVENOUS

## 2016-07-31 MED ORDER — ACETAMINOPHEN 325 MG PO TABS: 325.0000 mg | ORAL_TABLET | ORAL | Status: DC | PRN

## 2016-07-31 SURGICAL SUPPLY — 32 items

## 2016-07-31 NOTE — Anesthesia Preprocedure Evaluation (Addendum)
Anesthesia Evaluation  Patient identified by MRN, date of birth, ID band  Reviewed: Allergy & Precautions, NPO status , Patient's Chart, lab work & pertinent test results  History of Anesthesia Complications (+) PONV and history of anesthetic complications  Airway Mallampati: II  TM Distance: >3 FB Neck ROM: Full    Dental no notable dental hx.    Pulmonary former smoker,    Pulmonary exam normal        Cardiovascular hypertension, Pt. on medications Normal cardiovascular exam     Neuro/Psych  Headaches, Seizures -, Well Controlled,   Neuromuscular disease    GI/Hepatic Neg liver ROS, GERD  Medicated and Controlled,  Endo/Other  negative endocrine ROS  Renal/GU negative Renal ROS     Musculoskeletal  (+) Arthritis , Osteoarthritis,  Fibromyalgia -  Abdominal   Peds  Hematology negative hematology ROS (+)   Anesthesia Other Findings   Reproductive/Obstetrics                            Anesthesia Physical Anesthesia Plan  ASA: II  Anesthesia Plan: MAC   Post-op Pain Management:    Induction: Intravenous  Airway Management Planned:   Additional Equipment:   Intra-op Plan:   Post-operative Plan:   Informed Consent: I have reviewed the patients History and Physical, chart, labs and discussed the procedure including the risks, benefits and alternatives for the proposed anesthesia with the patient or authorized representative who has indicated his/her understanding and acceptance.     Plan Discussed with: CRNA  Anesthesia Plan Comments:         Anesthesia Quick Evaluation

## 2016-07-31 NOTE — Transfer of Care (Signed)
Immediate Anesthesia Transfer of Care Note  Patient: Charlotte Hernandez  Procedure(s) Performed: Procedure(s) with comments: ESOPHAGOGASTRODUODENOSCOPY (EGD) WITH PROPOFOL (N/A) - prefers later in the day has to take child to school   Patient Location: PACU  Anesthesia Type: MAC  Level of Consciousness: awake, alert  and patient cooperative  Airway and Oxygen Therapy: Patient Spontanous Breathing and Patient connected to supplemental oxygen  Post-op Assessment: Post-op Vital signs reviewed, Patient's Cardiovascular Status Stable, Respiratory Function Stable, Patent Airway and No signs of Nausea or vomiting  Post-op Vital Signs: Reviewed and stable  Complications: No apparent anesthesia complications

## 2016-07-31 NOTE — H&P (Signed)
Midge Minium, MD [facility] [address] [place], [place] [address] Phone: [phone] Fax : [phone]  Primary Care Physician:  Patrice Paradise, MD Primary Gastroenterologist:  Dr. Servando Snare  Pre-Procedure History & Physical: HPI:  Charlotte Hernandez is a 50 y.o. female is here for an endoscopy.   Past Medical History:  Diagnosis Date  . Arthritis    knees and hands  . Facial numbness   . Fibromyalgia   . GERD (gastroesophageal reflux disease)   . Headache    migraines  . Kidney stones   . Peripheral edema   . PONV (postoperative nausea and vomiting)   . Seizures (HCC)    LAST SEIZURE 2012 OR 2013-POSSIBLE SEIZURE 10-2015 BUT UNSURE-ADMITTED TO [facility] X 2 DAYS-POSSIBLE SEIZURE VS TIA-F/U WITH DR Clelia Croft AT [facility] ON 11-08-15  . TIA (transient ischemic attack) 2016   no deficits    Past Surgical History:  Procedure Laterality Date  . ABDOMINAL HYSTERECTOMY  2016   Total  . APPENDECTOMY  1990  . CARPAL TUNNEL RELEASE Left 2011   x2  . CHOLECYSTECTOMY N/A 06/10/2016   Procedure: LAPAROSCOPIC CHOLECYSTECTOMY WITH INTRAOPERATIVE CHOLANGIOGRAM;  Surgeon: Tiney Rouge III, MD;  Location: [facility] ORS;  Service: General;  Laterality: N/A;  . HAND SURGERY    . KNEE ARTHROSCOPY Right 1996 and 2017  . KNEE ARTHROSCOPY Left 2016  . KNEE ARTHROSCOPY WITH ANTERIOR CRUCIATE LIGAMENT (ACL) REPAIR WITH HAMSTRING GRAFT Right 12/11/2015   Procedure: KNEE ARTHROSCOPY WITH REVISION OF ANTERIOR CRUCIATE LIGAMENT (ACL) REPAIR ;  Surgeon: Juanell Fairly, MD;  Location: [facility] ORS;  Service: Orthopedics;  Laterality: Right;  . TONSILLECTOMY  1972    Prior to Admission medications   Medication Sig Start Date End Date Taking? Authorizing Provider  diphenhydramine-acetaminophen (TYLENOL PM) 25-500 MG TABS tablet Take 1 tablet by mouth at bedtime.   Yes Historical Provider, MD  ibuprofen (ADVIL,MOTRIN) 200 MG tablet Take 200 mg by mouth every 6 (six) hours as needed.   Yes Historical Provider,  MD  phentermine 37.5 MG capsule Take 37.5 mg by mouth every morning.   Yes Historical Provider, MD  topiramate (TOPAMAX) 100 MG tablet Take 100 mg by mouth 2 (two) times daily. Reported on 06/17/2016 09/11/15  Yes Historical Provider, MD  aspirin 325 MG tablet Take 325 mg by mouth daily.    Historical Provider, MD  Cyanocobalamin (VITAMIN B-12 CR) 1000 MCG TBCR Take 1,000 mcg by mouth daily. Reported on 06/17/2016    Historical Provider, MD  dexlansoprazole (DEXILANT) 60 MG capsule Take 1 capsule (60 mg total) by mouth daily. Samples given 07/27/16   Midge Minium, MD  hydrochlorothiazide (HYDRODIURIL) 25 MG tablet Take 25 mg by mouth daily.    Historical Provider, MD  HYDROcodone-acetaminophen (NORCO/VICODIN) 5-325 MG tablet Take 2 tablets by mouth every 6 (six) hours as needed for moderate pain. 06/10/16   Tiney Rouge III, MD  omeprazole (PRILOSEC) 40 MG capsule Take 40 mg by mouth every morning.  10/14/15   Historical Provider, MD  Turmeric 500 MG TABS Take 1 tablet by mouth daily at 12 noon. Reported on 06/17/2016    Historical Provider, MD    Allergies as of 07/27/2016  . (No Known Allergies)    Family History  Problem Relation Age of Onset  . Breast cancer Mother 28  . Breast cancer Maternal Grandmother     Social History   Social History  . Marital status: Married    Spouse name: N/A  . Number of  children: N/A  . Years of education: N/A   Occupational History  . Not on file.   Social History Main Topics  . Smoking status: Former Smoker    Packs/day: 1.00    Years: 8.00    Types: Cigarettes    Quit date: 11/29/2015  . Smokeless tobacco: Never Used  . Alcohol use Yes     Comment: OCC  . Drug use: No  . Sexual activity: Not on file   Other Topics Concern  . Not on file   Social History Narrative  . No narrative on file    Review of Systems: See HPI, otherwise negative ROS  Physical Exam: BP 102/62   Pulse 82   Temp 97.3 F (36.3 C) (Temporal)   Resp 16   Ht 5'  4" (1.626 m)   Wt 157 lb (71.2 kg)   SpO2 99%   BMI 26.95 kg/m  General:   Alert,  pleasant and cooperative in NAD Head:  Normocephalic and atraumatic. Neck:  Supple; no masses or thyromegaly. Lungs:  Clear throughout to auscultation.    Heart:  Regular rate and rhythm. Abdomen:  Soft, nontender and nondistended. Normal bowel sounds, without guarding, and without rebound.   Neurologic:  Alert and  oriented x4;  grossly normal neurologically.  Impression/Plan: Charlotte Hernandez is here for an endoscopy to be performed for abdominal pain  Risks, benefits, limitations, and alternatives regarding  endoscopy have been reviewed with the patient.  Questions have been answered.  All parties agreeable.   Midge Miniumarren Kylei Purington, MD  07/31/2016, 11:05 AM

## 2016-07-31 NOTE — Op Note (Signed)
[facility] Patient Name: Charlotte PeonRobin Hernandez Procedure Date: 07/31/2016 12:00 PM MRN: [id number] Account #: [id number] Date of Birth: 01/01/1966 Admit Type: Outpatient Age: 50 [facility] Gender: Female Note Status: Finalized Procedure:            Upper GI endoscopy Indications:          Abdominal pain in the left upper quadrant Providers:            Midge Miniumarren Corderro Koloski MD, MD Referring MD:         Charlotte HalstedMiriam J. Mclaughlin, MD (Referring MD) Medicines:            Propofol per Anesthesia Complications:        No immediate complications. Procedure:            Pre-Anesthesia Assessment:                       - Prior to the procedure, a History and Physical was                        performed, and patient medications and allergies were                        reviewed. The patient's tolerance of previous                        anesthesia was also reviewed. The risks and benefits of                        the procedure and the sedation options and risks were                        discussed with the patient. All questions were                        answered, and informed consent was obtained. Prior                        Anticoagulants: The patient has taken no previous                        anticoagulant or antiplatelet agents. ASA Grade                        Assessment: II - A patient with mild systemic disease.                        After reviewing the risks and benefits, the patient was                        deemed in satisfactory condition to undergo the                        procedure.                       After obtaining informed consent, the endoscope was                        passed under direct vision. Throughout the procedure,  the patient's blood pressure, pulse, and oxygen                        saturations were monitored continuously. The Olympus                        [id number]-HQ190 Endoscope (S#. [id number] [phone]) was  introduced                        through the mouth, and advanced to the second part of                        duodenum. The upper GI endoscopy was accomplished                        without difficulty. The patient tolerated the procedure                        well. Findings:      The examined esophagus was normal.      Localized moderate inflammation characterized by erythema was found in       the gastric antrum and in the stomach. Biopsies were taken with a cold       forceps for histology.      The examined duodenum was normal. Impression:           - Normal esophagus.                       - Gastritis. Biopsied.                       - Normal examined duodenum. Recommendation:       - Discharge patient to home.                       - Resume previous diet.                       - Continue present medications.                       - Await pathology results. Procedure Code(s):    --- Professional ---                       [phone], Esophagogastroduodenoscopy, flexible, transoral;                        with biopsy, single or multiple Diagnosis Code(s):    --- Professional ---                       R10.12, Left upper quadrant pain                       K29.70, Gastritis, unspecified, without bleeding CPT copyright 2016 American Medical [employer]. All rights reserved. The codes documented in this report are preliminary and upon coder review may  be revised to meet current compliance requirements. Midge Minium MD, MD 07/31/2016 12:10:05 PM This report has been signed electronically. Number of Addenda: 0 Note Initiated On: 07/31/2016 12:00 PM Total Procedure Duration: 0 hours 2 minutes 36 seconds       Charlotte Hernandez

## 2016-07-31 NOTE — Anesthesia Procedure Notes (Signed)
Procedure Name: MAC Performed by: Jariel Drost Pre-anesthesia Checklist: Patient identified, Emergency Drugs available, Suction available, Timeout performed and Patient being monitored Patient Re-evaluated:Patient Re-evaluated prior to inductionOxygen Delivery Method: Nasal cannula Placement Confirmation: positive ETCO2       

## 2016-07-31 NOTE — Anesthesia Postprocedure Evaluation (Signed)
Anesthesia Post Note  Patient: Charlotte Hernandez  Procedure(s) Performed: Procedure(s) (LRB): ESOPHAGOGASTRODUODENOSCOPY (EGD) WITH PROPOFOL (N/A)  Patient location during evaluation: PACU Anesthesia Type: MAC Level of consciousness: awake and alert and oriented Pain management: pain level controlled Vital Signs Assessment: post-procedure vital signs reviewed and stable Respiratory status: spontaneous breathing and nonlabored ventilation Cardiovascular status: stable Postop Assessment: no signs of nausea or vomiting and adequate PO intake Anesthetic complications: no    Harolyn RutherfordJoshua Aadil Sur

## 2016-08-04 ENCOUNTER — Encounter: Payer: Self-pay | Admitting: Gastroenterology

## 2016-08-05 ENCOUNTER — Encounter: Payer: Self-pay | Admitting: Gastroenterology

## 2016-08-10 ENCOUNTER — Encounter: Payer: Self-pay | Admitting: Gastroenterology

## 2016-12-31 ENCOUNTER — Other Ambulatory Visit: Payer: Self-pay | Admitting: Physician Assistant

## 2016-12-31 DIAGNOSIS — Z1231 Encounter for screening mammogram for malignant neoplasm of breast: Secondary | ICD-10-CM

## 2017-01-15 ENCOUNTER — Ambulatory Visit
Admission: RE | Admit: 2017-01-15 | Discharge: 2017-01-15 | Disposition: A | Discharge status: Home / Self Care | Payer: BLUE CROSS/BLUE SHIELD | Source: Ambulatory Visit | Attending: Physician Assistant | Admitting: Physician Assistant

## 2017-01-15 ENCOUNTER — Other Ambulatory Visit: Payer: Self-pay | Admitting: Physician Assistant

## 2017-01-15 DIAGNOSIS — R600 Localized edema: Secondary | ICD-10-CM

## 2017-01-15 DIAGNOSIS — H5711 Ocular pain, right eye: Secondary | ICD-10-CM | POA: Diagnosis present

## 2017-01-15 DIAGNOSIS — L03213 Periorbital cellulitis: Secondary | ICD-10-CM | POA: Insufficient documentation

## 2017-01-15 MED ORDER — IOPAMIDOL (ISOVUE-300) INJECTION 61%
75.0000 mL | Freq: Once | INTRAVENOUS | Status: AC | PRN
  Administered 2017-01-15: 75 mL via INTRAVENOUS

## 2017-02-09 ENCOUNTER — Ambulatory Visit: Payer: BLUE CROSS/BLUE SHIELD

## 2017-04-26 IMAGING — XA DG CHOLANGIOGRAM OPERATIVE
1 series · 4 of 4 positions shown · non-contrast
Comparison: Ultrasound 06/05/2016

CLINICAL DATA: Cholelithiasis

EXAM:
INTRAOPERATIVE CHOLANGIOGRAM
TECHNIQUE: Cholangiographic images from the C-arm fluoroscopic device were
submitted for interpretation post-operatively. Please see the
procedural report for the amount of contrast and the fluoroscopy
time utilized.

[Series 6: gastro standard · 4 of 354 frames shown]
[frame 54/354]
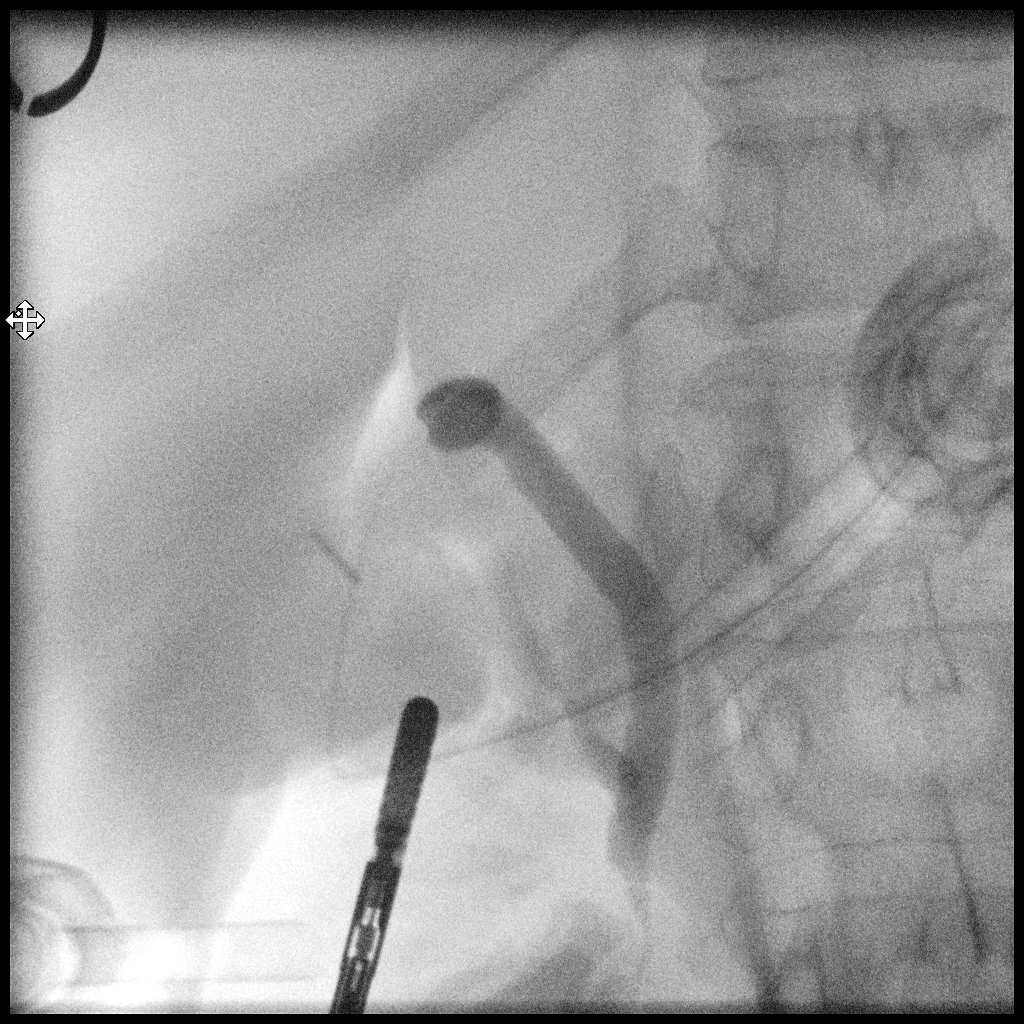
[frame 168/354]
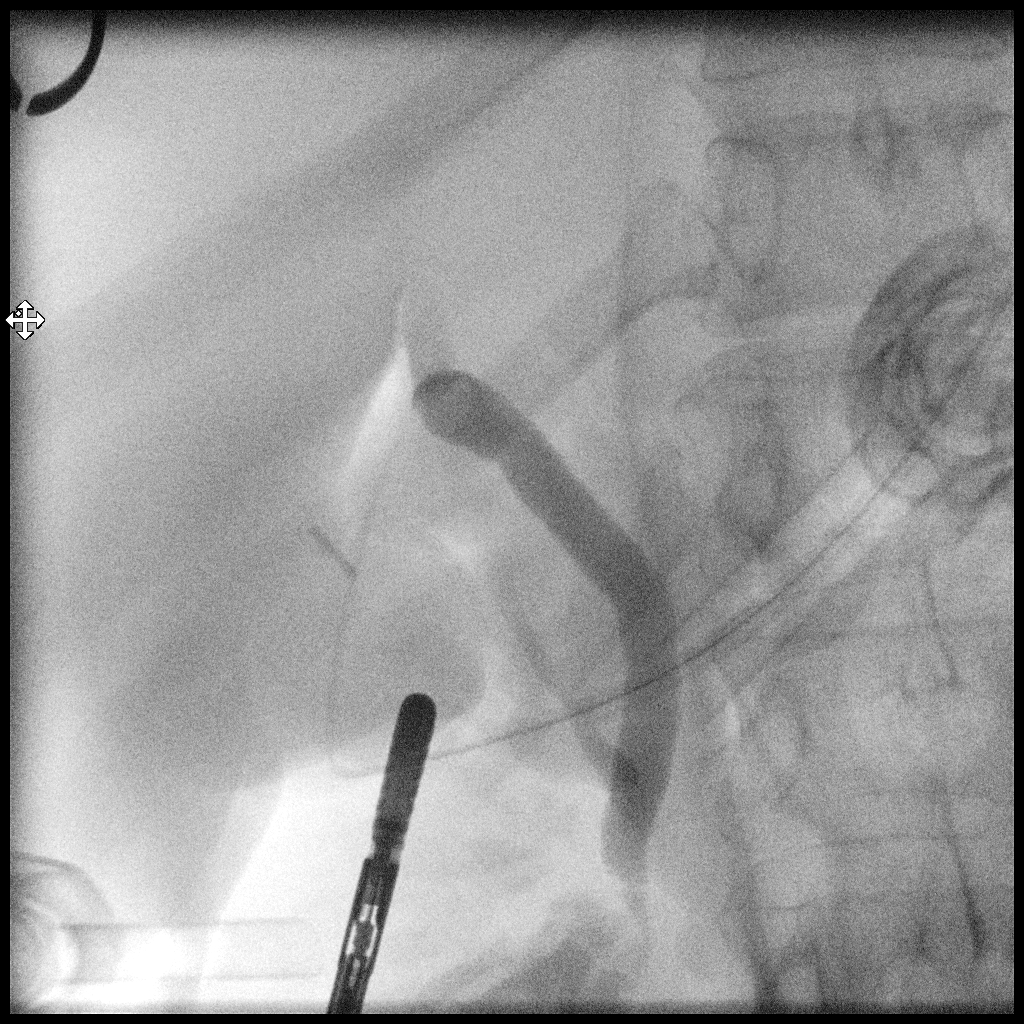
[frame 178/354]
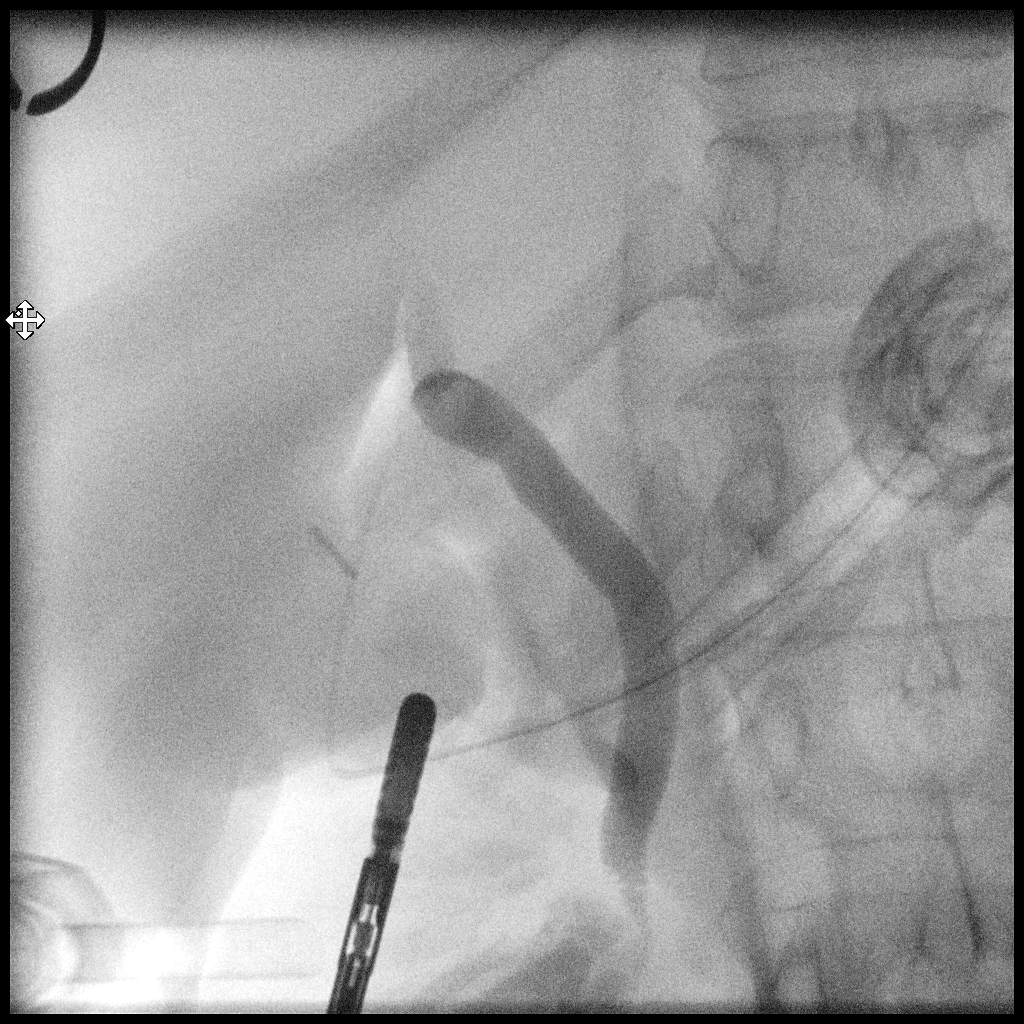
[frame 301/354]
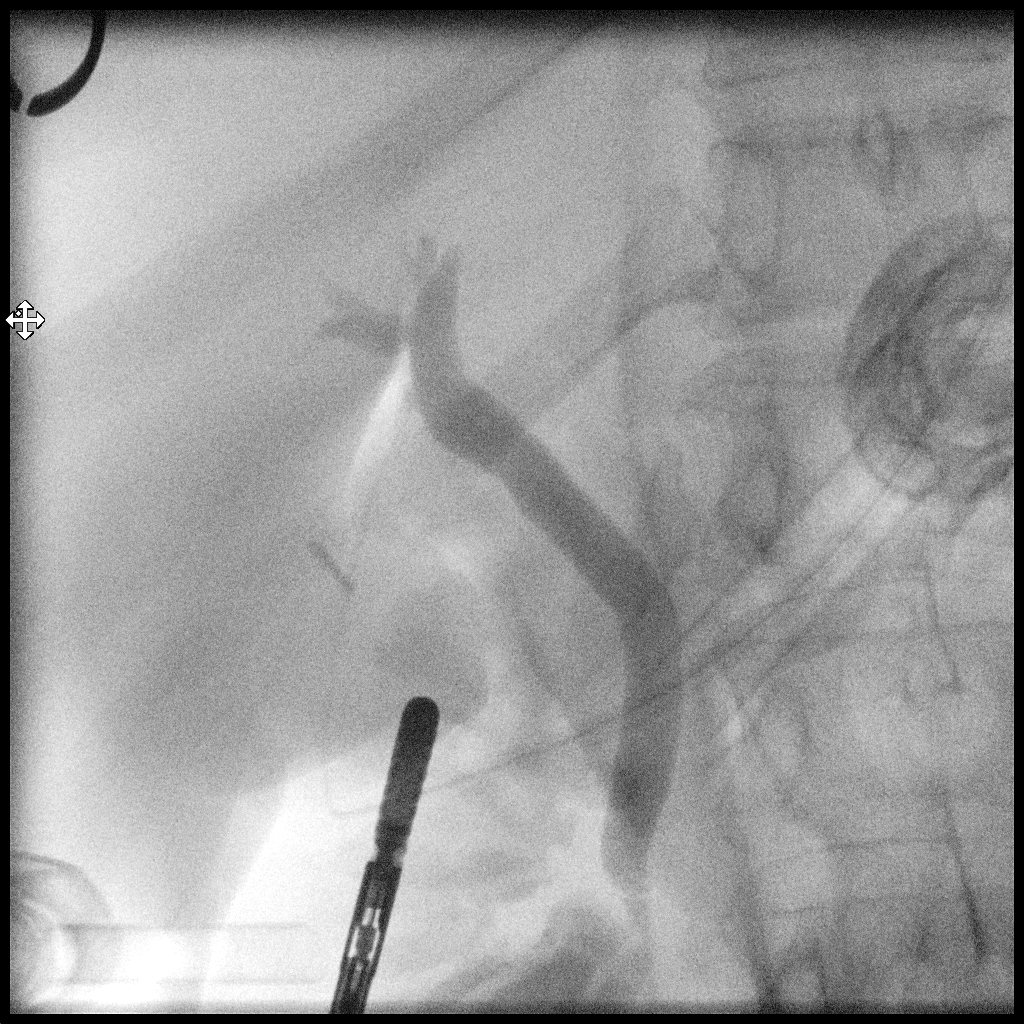

[4 of 4 positions shown; findings below may reference images not displayed]

FINDINGS: No persistent filling defects in the common duct. Intrahepatic ducts
are incompletely visualized, appearing decompressed centrally.
Contrast passes into the duodenum.

:
Negative for retained common duct stone.

## 2017-05-11 ENCOUNTER — Other Ambulatory Visit: Payer: Self-pay | Admitting: Chiropractic Medicine

## 2017-05-11 DIAGNOSIS — M9901 Segmental and somatic dysfunction of cervical region: Secondary | ICD-10-CM

## 2017-05-11 DIAGNOSIS — M5413 Radiculopathy, cervicothoracic region: Secondary | ICD-10-CM

## 2017-05-11 DIAGNOSIS — M531 Cervicobrachial syndrome: Secondary | ICD-10-CM

## 2018-05-11 ENCOUNTER — Other Ambulatory Visit: Payer: Self-pay | Admitting: Physician Assistant

## 2018-05-11 DIAGNOSIS — Z1231 Encounter for screening mammogram for malignant neoplasm of breast: Secondary | ICD-10-CM

## 2018-05-12 ENCOUNTER — Encounter (INDEPENDENT_AMBULATORY_CARE_PROVIDER_SITE_OTHER): Payer: Self-pay

## 2018-05-12 ENCOUNTER — Ambulatory Visit
Admission: RE | Admit: 2018-05-12 | Discharge: 2018-05-12 | Disposition: A | Discharge status: Home / Self Care | Payer: BLUE CROSS/BLUE SHIELD | Source: Ambulatory Visit | Attending: Physician Assistant | Admitting: Physician Assistant

## 2018-05-12 DIAGNOSIS — Z1231 Encounter for screening mammogram for malignant neoplasm of breast: Secondary | ICD-10-CM | POA: Diagnosis not present
